# Patient Record
Sex: Female | Born: 1965 | Race: Black or African American | Hispanic: No | Marital: Married | Smoking: Never smoker
Health system: Southern US, Community
[De-identification: ages and names within clinical notes are randomized; demographics above are authoritative.]

## PROBLEM LIST (undated history)

## (undated) DIAGNOSIS — R000 Tachycardia, unspecified: Secondary | ICD-10-CM

## (undated) DIAGNOSIS — K222 Esophageal obstruction: Secondary | ICD-10-CM

## (undated) DIAGNOSIS — J45909 Unspecified asthma, uncomplicated: Secondary | ICD-10-CM

## (undated) DIAGNOSIS — N926 Irregular menstruation, unspecified: Secondary | ICD-10-CM

## (undated) HISTORY — DX: Irregular menstruation, unspecified: N92.6

## (undated) HISTORY — DX: Esophageal obstruction: K22.2

## (undated) HISTORY — DX: Tachycardia, unspecified: R00.0

## (undated) HISTORY — DX: Unspecified asthma, uncomplicated: J45.909

## (undated) HISTORY — PX: NASAL POLYP SURGERY: SHX186

---

## 1997-12-27 ENCOUNTER — Other Ambulatory Visit: Admission: RE | Admit: 1997-12-27 | Discharge: 1997-12-27 | Payer: Self-pay | Admitting: *Deleted

## 1998-01-24 ENCOUNTER — Ambulatory Visit (HOSPITAL_COMMUNITY): Admission: RE | Admit: 1998-01-24 | Discharge: 1998-01-24 | Payer: Self-pay | Admitting: *Deleted

## 1998-03-25 ENCOUNTER — Ambulatory Visit (HOSPITAL_COMMUNITY): Admission: RE | Admit: 1998-03-25 | Discharge: 1998-03-25 | Payer: Self-pay | Admitting: *Deleted

## 1998-07-01 ENCOUNTER — Encounter (HOSPITAL_COMMUNITY): Admission: RE | Admit: 1998-07-01 | Discharge: 1998-07-04 | Payer: Self-pay | Admitting: *Deleted

## 1998-07-04 ENCOUNTER — Inpatient Hospital Stay (HOSPITAL_COMMUNITY): Admission: AD | Admit: 1998-07-04 | Discharge: 1998-07-07 | Payer: Self-pay | Admitting: *Deleted

## 1998-07-16 HISTORY — PX: TUBAL LIGATION: SHX77

## 1999-06-16 ENCOUNTER — Ambulatory Visit (HOSPITAL_COMMUNITY): Admission: RE | Admit: 1999-06-16 | Discharge: 1999-06-16 | Payer: Self-pay | Admitting: *Deleted

## 2010-05-16 ENCOUNTER — Ambulatory Visit: Payer: Self-pay | Admitting: Obstetrics & Gynecology

## 2010-05-16 LAB — HM PAP SMEAR: HM Pap smear: NEGATIVE

## 2010-11-28 NOTE — Assessment & Plan Note (Signed)
[name], [name]             ACCOUNT NO.:  [id number]   MEDICAL RECORD NO.:  [id number]          PATIENT TYPE:  POB   LOCATION:  [facility] [place]         FACILITY:  [facility]   PHYSICIAN:  Melanie Lincoln, MD      DATE OF BIRTH:  Dec 13, 1965   DATE OF SERVICE:  05/16/2010                                  CLINIC NOTE   HISTORY OF PRESENT ILLNESS:  The patient is a 45 year old para 2 female  who presents for her yearly exam.  The patient has regular periods.  She  has had bleeding in between her periods for 10 years.  She says that  sometimes it is just spotting but sometimes a little more.  The patient  has had endometrial biopsy and ultrasound done at [facility] in  [place] [facility] and records can be requested of this.  The patient thinks it  has something to do with her tubal ligation.   PAST MEDICAL HISTORY:  Denies all medical problems.   PAST SURGICAL HISTORY:  C-section x2 and BTL.   GYN HISTORY:  No history of abnormal Pap smears, endometriosis, fibroid  tumors, ovarian cysts or sexually transmitted diseases.   SOCIAL HISTORY:  The patient is an Data processing manager at [employer].  She does not smoke.  She drinks rare alcohol, does not do drugs, and has  never been a victim of sexual or physical abuse.   FAMILY HISTORY:  Positive for diabetes and high blood pressure in her  mother.   MEDICATIONS:  Ibuprofen p.r.n.   ALLERGIES:  None.   SYSTEMIC REVIEW:  Negative for all 13 points.   PHYSICAL EXAMINATION:  VITAL SIGNS:  Pulse 65, blood pressure 108/67,  weight 168, and height 67-1/2 inch.  GENERAL:  Well nourished, well developed, no apparent stress.  HEENT:  Normocephalic and atraumatic.  Good dentition.  Thyroid, no  masses.  LUNGS:  Clear to auscultation bilaterally.  HEART:  Regular rate and rhythm.  BREASTS:  No masses, nontender.  No lymphadenopathy.  No nipple  discharge.  ABDOMEN:  Soft.  Nontender.  No organomegaly.  No hernia.  GENITALIA:  Tanner V.   Vagina pink, normal rugae.  Cervix is closed.  Slightly pointed to the patient's left, uterus anteverted, nontender.  Adnexa, no masses, nontender.  EXTREMITIES:  No edema.   ASSESSMENT AND PLAN:  A 45 year old female for well-woman exam.  1. Pap smear.  2. Mammogram is up-to-date per the patient.  3. The patient has had a physical exam with blood laboratories drawn.      All this will be followed up by primary care doctor for blood work.      Request medical records from [facility], biopsy, ultrasound      and the last 2 years of notes.  4. We will call the patient to make sure that everything is up-to-date      and normal with these records.  If no adequate workup has been      done, I will suggest a transvaginal ultrasound and endometrial      biopsy.           ______________________________  Melanie Lincoln, MD     KL/MEDQ  D:  05/16/2010  T:  05/17/2010  Job:  [id number]

## 2010-12-01 NOTE — H&P (Signed)
[facility]  PatientVIETTA Chang                       MRN: [id number] Adm. Date:  [id number] Attending:  Deniece Ree                         History and Physical  HISTORY:                      Patient is a 45 year old multiparous female who is desirous of permanent sterilization.  This patient has indicated that she totally wishes for permanent sterilization.  All of her questions were answered to her satisfaction.  She understands that this procedure is intended to be permanent;  however, cannot be guaranteed.  She is also aware of the different types of contraceptives available.  The possible complications have also been explained,  which this patient accepts.  PAST MEDICAL HISTORY, FAMILY HISTORY, REVIEW OF SYSTEMS:   Reveal that she has had cesarean sections x 2.  PHYSICAL EXAMINATION:  GENERAL:                      Reveals a well-developed, well-nourished female in no acute distress.  HEENT:                        Within normal limits.  NECK:                         Supple.  BREASTS:                      Without masses, tenderness, or discharge.  LUNGS:                        Clear to percussion and auscultation.  HEART:                        Normal sinus rhythm without murmur, rub, or gallop.  ABDOMEN:                      Benign.  EXTREMITIES, NEUROLOGIC:      Within normal limits.  PELVIC:                       Revealed external genitalia, BUSB within normal limits.  The vagina is clear.  Cervix firm and nontender.  The uterus is approximately six to seven weeks in size, slightly boggy.  The adnexa is benign.  DIAGNOSIS:                    Multiparity, desirous of permanent sterilization.  OPERATION:                    Laparoscopic bilateral tubular cauterization with  tubal resection. DD:  06/16/99 TD:  06/16/99 Job: [id number]

## 2010-12-15 LAB — HM MAMMOGRAPHY: HM Mammogram: NEGATIVE

## 2011-02-22 ENCOUNTER — Encounter: Payer: Self-pay | Admitting: *Deleted

## 2011-02-27 ENCOUNTER — Encounter: Payer: Self-pay | Admitting: Obstetrics & Gynecology

## 2011-02-27 ENCOUNTER — Other Ambulatory Visit: Payer: Self-pay | Admitting: Obstetrics & Gynecology

## 2011-02-27 ENCOUNTER — Ambulatory Visit (INDEPENDENT_AMBULATORY_CARE_PROVIDER_SITE_OTHER): Payer: PRIVATE HEALTH INSURANCE | Admitting: Obstetrics & Gynecology

## 2011-02-27 VITALS — BP 125/81 | HR 102 | Temp 98.0°F | Resp 16 | Ht 67.5 in | Wt 161.0 lb

## 2011-02-27 DIAGNOSIS — N949 Unspecified condition associated with female genital organs and menstrual cycle: Secondary | ICD-10-CM

## 2011-02-27 DIAGNOSIS — N926 Irregular menstruation, unspecified: Secondary | ICD-10-CM

## 2011-02-27 DIAGNOSIS — R1031 Right lower quadrant pain: Secondary | ICD-10-CM

## 2011-02-27 DIAGNOSIS — R102 Pelvic and perineal pain: Secondary | ICD-10-CM

## 2011-02-27 HISTORY — DX: Irregular menstruation, unspecified: N92.6

## 2011-02-27 MED ORDER — HYDROCODONE-ACETAMINOPHEN 5-500 MG PO TABS: 1.0000 | ORAL_TABLET | ORAL | Status: DC | PRN

## 2011-02-27 NOTE — Progress Notes (Signed)
  Subjective:    Patient ID: [name], female    DOB: 11-Feb-1966, 45 y.o.   MRN: [id number]  HPI Pt complaining of pain 9in right adnexa for 3 weeks.  Pain is not assic with sexual intercourse, bowel mvmts, or urination.  No activity makes it worse of better.  Pt takes OTC ibuprofen once a day that helps temporarily.  Pt has had ovarian cyst in the past.   Review of Systems  Constitutional: Negative.   Respiratory: Negative.   Cardiovascular: Negative.   Genitourinary: Positive for pelvic pain.  Musculoskeletal: Positive for back pain.  Neurological: Negative.   Psychiatric/Behavioral: Negative.        Objective:   Physical Exam  Constitutional: She appears well-developed and well-nourished. No distress.  HENT:  Head: Normocephalic and atraumatic.  Eyes: Conjunctivae are normal.  Pulmonary/Chest: Effort normal.  Abdominal: Soft. She exhibits no distension and no mass. There is no tenderness. There is no rebound and no guarding.  Genitourinary: Vagina normal. No vaginal discharge found.       No CMT.   Uterus feels nml size.  Pain in rt adnexa with deep palpation.  There is fullness but no mass.  Left adnexa is WNL.          Assessment & Plan:  45 yo female with right sided pelvic pain x 3 weeks and spotting between menses.  1.  TV US 2.  If poylp is seen on [place] will proceed with polypectomy +/- ablation.  Endomet bx would be warrented again (last biopsy is 04/2009). 3.  Will call pt with results of [place].

## 2011-02-27 NOTE — Patient Instructions (Signed)
Ovarian Cyst   The ovaries are small organs that are on each side of the uterus. The ovaries are the organs that produce the female hormones, estrogen and progesterone. An ovarian cyst is a sac filled with fluid that can vary in its size. It is normal for a small cyst to form in women who are in the childbearing age and who have menstrual periods. This type of cyst is called a follicle cyst that becomes an ovulation cyst (corpus luteum cyst) after it produces the women’s egg. It later goes away on its own if the woman does not become pregnant. There are other kinds of ovarian cysts that may cause problems and may need to be treated. The most serious problem is a cyst with cancer. It should be noted that menopausal women who have an ovarian cyst are at a higher risk of it being a cancer cyst. They should be evaluated very quickly, thoroughly and followed closely. This is especially true in menopausal women because of the high rate of ovarian cancer in women in menopause.   CAUSES AND TYPES OF OVARIAN CYSTS:   FUNCTIONAL CYST: The follicle/corpus luteum cyst is a functional cyst that occurs every month during ovulation with the menstrual cycle. They go away with the next menstrual cycle if the woman does not get pregnant. Usually, there are no symptoms with a functional cyst.   ENDOMETRIOMA CYST: This cyst develops from the lining of the uterus tissue. This cyst gets in or on the ovary. It grows every month from the bleeding during the menstrual period. It is also called a “chocolate cyst” because it becomes filled with blood that turns brown. This cyst can cause pain in the lower abdomen during intercourse and with your menstrual period.   CYSTADENOMA CYST: This cyst develops from the cells on the outside of the ovary. They usually are not cancerous. They can get very big and cause lower abdomen pain and pain with intercourse. This type of cyst can twist on itself, cut off its blood supply and cause severe pain. It  also can easily rupture and cause a lot of pain.   DERMOID CYST: This type of cyst is sometimes found in both ovaries. They are found to have different kinds of body tissue in the cyst. The tissue includes skin, teeth, hair, and/or cartilage. They usually do not have symptoms unless they get very big. Dermoid cysts are rarely cancerous.   POLYCYSTIC OVARY: This is a rare condition with hormone problems that produces many small cysts on both ovaries. The cysts are follicle-like cysts that never produce an egg and become a corpus luteum. It can cause an increase in body weight, infertility, acne, increase in body and facial hair and lack of menstrual periods or rare menstrual periods. Many women with this problem develop type 2 diabetes. The exact cause of this problem is unknown. A polycystic ovary is rarely cancerous.   THECA LUTEIN CYST: Occurs when too much hormone (human chorionic gonadotropin) is produced and over-stimulates the ovaries to produce an egg. They are frequently seen when doctors stimulate the ovaries for invitro-fertilization (test tube babies).   LUTEOMA CYST: This cyst is seen during pregnancy. Rarely it can cause an obstruction to the birth canal during labor and delivery. They usually go away after delivery.   SYMPTOMS   Pelvic pain or pressure.   Pain during sexual intercourse.   Increasing girth (swelling) of the abdomen.   Abnormal menstrual periods.   Increasing pain with menstrual   periods.   You stop having menstrual periods and you are not pregnant.   DIAGNOSIS   The diagnosis can be made during:   Routine or annual pelvic examination (common).   Ultrasound.   X-ray of the pelvis.   CT Scan.   MRI.   Blood tests.   TREATMENT   Treatment may only be to follow the cyst monthly for 2 to 3 months with your caregiver. Many go away on their own, especially functional cysts.   May be aspirated (drained) with a long needle with ultrasound, or by laparoscopy (inserting a tube into the pelvis  through a small incision).   The whole cyst can be removed by laparoscopy.   Sometimes the cyst may need to be removed through an incision in the lower abdomen.   Hormone treatment is sometimes used to help dissolve certain cysts.   Birth control pills are sometimes used to help dissolve certain cysts.   HOME CARE INSTRUCTIONS   Follow your caregiver's advice regarding:   Medicine.   Follow up visits to evaluate and treat the cyst.   You may need to come back or make an appointment with another caregiver, to find the exact cause of your cyst, if your caregiver is not a gynecologist.   Get your yearly and recommended pelvic examinations and Pap tests.   Let your caregiver know if you have had an ovarian cyst in the past.   SEEK MEDICAL CARE IF:   Your periods are late, irregular, they stop, or are painful.   Your stomach (abdomen) or pelvic pain does not go away.   Your stomach becomes larger or swollen.   You have pressure on your bladder or trouble emptying your bladder completely.   You have painful sexual intercourse.   You have feelings of fullness, pressure, or discomfort in your stomach.   You lose weight for no apparent reason.   You feel generally ill.   You become constipated.   You lose your appetite.   You develop acne.   You have an increase in body and facial hair.   You are gaining weight, without changing your exercise and eating habits.   You think you are pregnant.   SEEK IMMEDIATE MEDICAL CARE IF:   You have increasing abdominal pain.   You feel sick to your stomach (nausea) and/or vomit.   You develop a fever that comes on suddenly.   You develop abdominal pain during a bowel movement.   Your menstrual periods become heavier than usual.   Document Released: 07/02/2005 Document Re-Released: 06/20/2009   Exit[employer]® Patient Information ©2011 [employer].

## 2011-03-02 ENCOUNTER — Ambulatory Visit
Admission: RE | Admit: 2011-03-02 | Discharge: 2011-03-02 | Disposition: A | Discharge status: Home / Self Care | Payer: PRIVATE HEALTH INSURANCE | Source: Ambulatory Visit | Attending: Obstetrics & Gynecology | Admitting: Obstetrics & Gynecology

## 2011-03-02 DIAGNOSIS — R1031 Right lower quadrant pain: Secondary | ICD-10-CM

## 2011-03-02 DIAGNOSIS — R102 Pelvic and perineal pain: Secondary | ICD-10-CM

## 2011-03-21 ENCOUNTER — Encounter: Payer: Self-pay | Admitting: Obstetrics & Gynecology

## 2011-03-21 ENCOUNTER — Ambulatory Visit (INDEPENDENT_AMBULATORY_CARE_PROVIDER_SITE_OTHER): Payer: PRIVATE HEALTH INSURANCE | Admitting: Obstetrics & Gynecology

## 2011-03-21 VITALS — BP 142/80 | HR 73 | Wt 160.0 lb

## 2011-03-21 DIAGNOSIS — N938 Other specified abnormal uterine and vaginal bleeding: Secondary | ICD-10-CM

## 2011-03-21 DIAGNOSIS — N949 Unspecified condition associated with female genital organs and menstrual cycle: Secondary | ICD-10-CM

## 2011-03-21 DIAGNOSIS — N925 Other specified irregular menstruation: Secondary | ICD-10-CM

## 2011-03-21 LAB — POCT URINE PREGNANCY: Preg Test, Ur: NEGATIVE

## 2011-03-21 NOTE — Patient Instructions (Signed)
Endometrial Biopsy   This is a test in which a tissue sample (a biopsy) is taken from inside the uterus (womb). It is then looked at by a specialist under a microscope to see if the tissue is normal or abnormal. The endometrium is the lining of the uterus. This test helps determine where you are in your menstrual cycle and  how hormone levels are affecting the lining of the uterus. Another use for this test is to diagnose endometrial cancer, tuberculosis, polyps, or inflammatory conditions and to evaluate uterine bleeding.   PREPARATION FOR TEST: No preparation or fasting is necessary.   NORMAL FINDINGS: No pathologic conditions. Presence of "secretory-type" endometrium 3 to 5 days before to normal menstruation.   YOUR FINDINGS ARE:  __________________________________________________   MEANING OF TEST Your caregiver will go over the test results with you and discuss the importance and meaning of your results, as well as treatment options and the need for additional tests if necessary.   OBTAINING THE TEST RESULTS It is your responsibility to obtain your test results.  Ask the lab or department performing the test when and how you will get your results.   **"Normal" ranges for lab values and other tests may vary among different laboratories and/or hospitals.  You should always check with your doctor after having lab work or other tests done to discuss the meaning of your test results and whether or not your values are considered "within normal limits".   Document Released: 11/02/2004  Document Re-Released: 09/28/2008 [facility] Patient Information 2011 [place], [place].Endometriosis Cramps, Pain and Infertility Up to ten per cent of women in child bearing years have endometriosis. Endometriosis is a disease that occurs when the endometrium (lining of the uterus) is misplaced outside of its normal location. It may occur in many locations close to the uterus (womb), but commonly on the ovaries,  fallopian tubes, vagina (birth canal) and bowel located close to the uterus. Because the uterus sloughs (expels) its lining every month (menses), there is bleeding where ever the endometrial tissue is located. SYMPTOMS Often there are no symptoms (problems); however because blood is irritating to tissues not normally exposed to it, when symptoms occur they vary with the location of the misplaced endometrium. Symptoms often include back and abdominal (belly) pain. Periods may be heavier and intercourse may be painful. Infertility may be present. You may have all of these symptoms at one time or another or you may have months with no symptoms at all. Although the symptoms occur mainly during menses, they can occur mid-cycle as well, and usually terminate with menopause. DIAGNOSIS You will have to be seen by your caregiver for a diagnosis (learning what is wrong). Your caregiver may recommend a blood test and urine test (urinalysis) to help rule out other conditions. Another common test is ultrasound, a painless procedure that uses sound waves to make a sonogram "picture" of abnormal tissue that could be endometriosis. If your bowel movements are painful around your periods, your caregiver may advise a barium enema (an x-ray of the lower bowel), to try to find the source of your pain. This is sometimes confirmed by laparoscopy. Laparoscopy is a procedure where your caregiver looks into your abdomen with a laparoscope (a small pencil sized telescope). Your caregiver may take a tiny piece of tissue (biopsy) from any abnormal tissue to confirm or document your problem. These tissues are sent to the lab and a pathologist looks at them under the microscope to give a microscopic diagnoses. TREATMENT  Once the diagnosis is made, it can be treated by destruction of the misplaced endometrial tissue using heat (diathermy), laser, cutting (excision), or chemical means. It may also be treated with hormonal therapy. When using  hormonal therapy menses are eliminated, therefore eliminating the monthly exposure to blood by the misplaced endometrial tissue. Only in severe cases is it necessary to perform a hysterectomy with removal of the tubes, uterus and ovaries. HOME CARE INSTRUCTIONS  Only take over-the-counter or prescription medicines for pain, discomfort, or fever as directed by your caregiver.   Avoid activities that produce pain, including a physical sexual relationship.   Do not take aspirin as this may increase bleeding when not on hormonal therapy.   See your caregiver for pain or problems not controlled with treatment.  SEEK IMMEDIATE MEDICAL CARE IF:  Your pain is severe and is not responding to pain medication.   You develop severe nausea and vomiting or can't keep foods down.   Your pain localizes to the right lower part of your abdomen (possible appendicitis).   There is swelling or increasing pain in the abdomen.   An unexplained oral temperature above 102 F (38.9 C) develops, or as your caregiver suggests.   You see blood in your stool.  MAKE SURE YOU:   Understand these instructions.   Will watch your condition.   Will get help right away if you are not doing well or get worse.  Document Released: 06/29/2000 Document Re-Released: 06/14/2008 [facility] Patient Information 2011 [place], [place].

## 2011-03-21 NOTE — Progress Notes (Signed)
  Subjective:    Patient ID: [name], female    DOB: November 04, 1965, 45 y.o.   MRN: [id number]  HPI Pt c/o pelvic pain for 8 weeks. The pain is on the right side.  Pt's pain is relieved with 800 mg ibuprofen once a day.  Pain not increased with BM, urination, sex, or with menstrual cycle.  Pt also having irregular bleeding.  Pt spots frequently throughout the month.  US shows 15 mm lining, 2.4 cm intramural fibroid, no ovarian cysts.  Pt has BM two times a week. Pt reports stool caliber is soft and long.  Since lining is 15mm and the pt is bleeding irregularly, endometrial biopsy should be done to r/o hyperplasia or malignancy    Review of Systems  Constitutional: Negative.   Respiratory: Negative.   Gastrointestinal:       Some gas with taking ibuprofen.  Genitourinary: Positive for vaginal bleeding and pelvic pain.  Neurological: Negative for dizziness.  Psychiatric/Behavioral: Negative.        Objective:   Physical Exam  Constitutional: She is oriented to person, place, and time. She appears well-nourished. No distress.  HENT:  Head: Normocephalic and atraumatic.  Eyes: Conjunctivae are normal.  Pulmonary/Chest: Effort normal.  Abdominal: Soft. She exhibits no distension. There is no tenderness.  Genitourinary: Uterus normal. Uterus is not tender. Cervix exhibits no motion tenderness, no discharge and no friability. Right adnexum displays tenderness. Right adnexum displays no mass and no fullness. Left adnexum displays no mass, no tenderness and no fullness.    Neurological: She is alert and oriented to person, place, and time.  Skin: Skin is warm and dry.  Psychiatric: She has a normal mood and affect.   UPT negative  Patient given informed consent, signed copy in the chart, time out was performed. Appropriate time out taken. . The patient was placed in the lithotomy position and the cervix brought into view with sterile speculum.  Portio of cervix cleansed x 2 with betadine  swabs.  A tenaculum was placed in the anterior lip of the cervix.  The uterus was sounded for depth of 10 cm. A pipelle was introduced to into the uterus, suction created,  and an endometrial sample was obtained. All equipment was removed and accounted for.  The patient tolerated the procedure well.   Cervical biopsy taken at 10 o'clock.  Bleeding controlled with silver nitrate.   Patient given post procedure instructions.  Pt wants to be called with result.    Assessment & Plan:  45 yo female with left sided pelvic pain for 8 weeks and irregular bleeding.  1.  Small fibroid in uterus.  Uterus not tender.  Minimal pain with deep palpation in rt adnexa.  ?endometriosis?  If biopsies are negative, will proceed with abd pelvic CT scan with contrast.  Also GI consult should be considered.  2.  Irregular bleeding--endometrial biopsy with 2 passes  3.  Naproxen sodium for pain.

## 2011-03-30 ENCOUNTER — Telehealth: Payer: Self-pay | Admitting: *Deleted

## 2011-03-30 ENCOUNTER — Encounter: Payer: Self-pay | Admitting: *Deleted

## 2011-03-30 NOTE — Telephone Encounter (Signed)
Notified pt that her endometrial biopsy was benign and that Dr Penne Lash is recommending a pelvic CT.  Pt to check her schedule and call me back to schedule.  As of this date pt has not called to schedule and letter is sent to pt to remind her to call.

## 2012-01-03 ENCOUNTER — Other Ambulatory Visit: Payer: Self-pay | Admitting: Obstetrics & Gynecology

## 2012-01-03 DIAGNOSIS — Z1231 Encounter for screening mammogram for malignant neoplasm of breast: Secondary | ICD-10-CM

## 2012-01-22 ENCOUNTER — Ambulatory Visit: Payer: PRIVATE HEALTH INSURANCE

## 2012-01-22 ENCOUNTER — Ambulatory Visit (INDEPENDENT_AMBULATORY_CARE_PROVIDER_SITE_OTHER): Payer: PRIVATE HEALTH INSURANCE

## 2012-01-22 DIAGNOSIS — Z1231 Encounter for screening mammogram for malignant neoplasm of breast: Secondary | ICD-10-CM

## 2012-02-19 ENCOUNTER — Ambulatory Visit (INDEPENDENT_AMBULATORY_CARE_PROVIDER_SITE_OTHER): Payer: PRIVATE HEALTH INSURANCE | Admitting: Obstetrics & Gynecology

## 2012-02-19 ENCOUNTER — Encounter: Payer: Self-pay | Admitting: Obstetrics & Gynecology

## 2012-02-19 VITALS — BP 121/81 | HR 73 | Temp 98.5°F | Resp 16 | Ht 67.5 in | Wt 157.0 lb

## 2012-02-19 DIAGNOSIS — Z124 Encounter for screening for malignant neoplasm of cervix: Secondary | ICD-10-CM

## 2012-02-19 DIAGNOSIS — Z0000 Encounter for general adult medical examination without abnormal findings: Secondary | ICD-10-CM

## 2012-02-19 NOTE — Progress Notes (Signed)
Subjective:    [name] is a 46 y.o. female who presents for an annual exam. The patient has no complaints today. The patient is sexually active. GYN screening history: last pap: was normal. The patient wears seatbelts: yes. The patient participates in regular exercise: yes. Has the patient ever been transfused or tattooed?: no. The patient reports that there is not domestic violence in her life.   Menstrual History: OB History    Grav Para Term Preterm Abortions TAB SAB Ect Mult Living   2 2        2       Menarche age: 50 Patient's last menstrual period was 02/14/2012.    The following portions of the patient's history were reviewed and updated as appropriate: allergies, current medications, past family history, past medical history, past social history, past surgical history and problem list.  Review of Systems A comprehensive review of systems was negative. She has been married for 23 years, denies dysparunia. Her mammogram was normal last month. 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   Objective:    BP 121/81  Pulse 73  Temp 98.5 F (36.9 C) (Oral)  Resp 16  Ht 5' 7.5" (1.715 m)  Wt 157 lb (71.215 kg)  BMI 24.23 kg/m2  LMP 02/14/2012  General Appearance:    Alert, cooperative, no distress, appears stated age  Head:    Normocephalic, without obvious abnormality, atraumatic  Eyes:    PERRL, conjunctiva/corneas clear, EOM's intact, fundi    benign, both eyes  Ears:    Normal TM's and external ear canals, both ears  Nose:   Nares normal, septum midline, mucosa normal, no drainage    or sinus tenderness  Throat:   Lips, mucosa, and tongue normal; teeth and gums normal  Neck:   Supple, symmetrical, trachea midline, no adenopathy;    thyroid:  no enlargement/tenderness/nodules; no carotid   bruit or JVD  Back:     Symmetric, no curvature, ROM normal, no CVA tenderness  Lungs:     Clear to auscultation bilaterally, respirations unlabored  Chest Wall:    No  tenderness or deformity   Heart:    Regular rate and rhythm, S1 and S2 normal, no murmur, rub   or gallop  Breast Exam:    No tenderness, masses, or nipple abnormality  Abdomen:     Soft, non-tender, bowel sounds active all four quadrants,    no masses, no organomegaly  Genitalia:    Normal female without lesion, discharge or tenderness, NSSA, NT, no adnexal masses     Extremities:   Extremities normal, atraumatic, no cyanosis or edema  Pulses:   2+ and symmetric all extremities  Skin:   Skin color, texture, turgor normal, no rashes or lesions  Lymph nodes:   Cervical, supraclavicular, and axillary nodes normal  Neurologic:   CNII-XII intact, normal strength, sensation and reflexes    throughout  .    Assessment:    Healthy female exam.    Plan:     Pap smear.

## 2012-03-07 ENCOUNTER — Ambulatory Visit: Payer: PRIVATE HEALTH INSURANCE

## 2012-12-16 ENCOUNTER — Ambulatory Visit (INDEPENDENT_AMBULATORY_CARE_PROVIDER_SITE_OTHER): Payer: PRIVATE HEALTH INSURANCE | Admitting: Family Medicine

## 2012-12-16 ENCOUNTER — Encounter: Payer: Self-pay | Admitting: Family Medicine

## 2012-12-16 VITALS — BP 138/88 | HR 114 | Ht 67.5 in | Wt 142.0 lb

## 2012-12-16 DIAGNOSIS — Z130 Encounter for screening for diseases of the blood and blood-forming organs and certain disorders involving the immune mechanism: Secondary | ICD-10-CM

## 2012-12-16 DIAGNOSIS — K222 Esophageal obstruction: Secondary | ICD-10-CM

## 2012-12-16 DIAGNOSIS — Z131 Encounter for screening for diabetes mellitus: Secondary | ICD-10-CM

## 2012-12-16 DIAGNOSIS — Z13220 Encounter for screening for lipoid disorders: Secondary | ICD-10-CM

## 2012-12-16 DIAGNOSIS — R030 Elevated blood-pressure reading, without diagnosis of hypertension: Secondary | ICD-10-CM

## 2012-12-16 HISTORY — DX: Esophageal obstruction: K22.2

## 2012-12-16 NOTE — Progress Notes (Signed)
CC: [name] is a 47 y.o. female is here for Establish Care   Subjective: HPI:  Pleasant 47 year old here to establish care, works in exercise room downstairs , unfortunately husband diagnosed with stage IV colon cancer last month  Was told during esophageal dilation that her blood pressure was elevated, she is unsure of exact numbers. She denies history of elevated blood pressure. No outside blood pressures to report. She occasionally eats salty foods but does not add salt to food. She works out 3-4 times a week. Prior to this dilation she was having trouble fully swallowing solid foods and slightly having trouble with liquids. This is of moderate severity with all meals she is now able to eat or drink whatever she wants without difficulty.  She tells me she has a history of irregular menstrual bleeding. Over the past year this has been much more predictable without spotting between menses. She denies any irregularity over the last 12 months.  She believes it's been well over 10 years since her last tetanus immunization  Review of Systems - General ROS: negative for - chills, fever, night sweats, weight gain or weight loss Ophthalmic ROS: negative for - decreased vision Psychological ROS: negative for - anxiety or depression ENT ROS: negative for - hearing change, nasal congestion, tinnitus or allergies Hematological and Lymphatic ROS: negative for - bleeding problems, bruising or swollen lymph nodes Breast ROS: negative Respiratory ROS: no cough, shortness of breath, or wheezing Cardiovascular ROS: no chest pain or dyspnea on exertion Gastrointestinal ROS: no abdominal pain, change in bowel habits, or black or bloody stools Genito-Urinary ROS: negative for - genital discharge, genital ulcers, incontinence or abnormal bleeding from genitals Musculoskeletal ROS: negative for - joint pain or muscle pain Neurological ROS: negative for - headaches or memory loss Dermatological ROS:  negative for lumps, mole changes, rash and skin lesion changes  Past Medical History  Diagnosis Date  . Irregular menstrual bleeding 02/27/2011  . Esophageal stricture 12/16/2012    Dilated Spring 2014      Family History  Problem Relation Age of Onset  . Diabetes Mother   . Hypertension Mother      History  Substance Use Topics  . Smoking status: Never Smoker   . Smokeless tobacco: Never Used  . Alcohol Use: Yes     Comment: occasionally     Objective: Filed Vitals:   12/16/12 1616  BP: 138/88  Pulse:     General: Alert and Oriented, No Acute Distress HEENT: Pupils equal, round, reactive to light. Conjunctivae clear.  Moist mucous membranes Lungs: Clear to auscultation bilaterally, no wheezing/ronchi/rales.  Comfortable work of breathing. Good air movement. Cardiac: Regular rate and rhythm. Normal S1/S2.  No murmurs, rubs, nor gallops.   Extremities: No peripheral edema.  Strong peripheral pulses.  Mental Status: No depression, anxiety, nor agitation. Skin: Warm and dry.  Assessment & Plan: Vina was seen today for establish care.  Diagnoses and associated orders for this visit:  Screening, lipid - Lipid panel  Screening for diabetes mellitus - BASIC METABOLIC PANEL WITH GFR  Elevated blood pressure - TSH - BASIC METABOLIC PANEL WITH GFR  Screening, anemia, deficiency, iron - CBC  Esophageal stricture    Elevated blood pressure: On recheck manually systolic has improved by 15 points. Discussed diet and exercise interventions to minimize need for antihypertensives.  Will recheck at upcoming CPE. We'll rule out hyperthyroidism Esophageal stricture: Controlled and now asymptomatic, resolved She is due for lipid screening, fasting blood sugar, renal  function which will be obtained 2 days prior to CPE She would like to think about whether or not she wants to get a Pap smear this year, no history of abnormalities, most recent last year She will think about  whether or not she wants a tetanus immunization, encouraged to get today, however she would like some more time History of menstrual irregularity: Controlled and resolved, we'll screen for anemia or thrombocytopenia  30 minutes spent face-to-face during visit today of which at least 50% was counseling or coordinating care regarding elevated blood pressure, history of irregular menses, esophageal stricture, immunization counseling    Return in about 2 weeks (around 12/30/2012) for CPE.

## 2012-12-16 NOTE — Patient Instructions (Signed)
Think about the following  -Tetanus shot at upcoming visit  -complete physical exam, get labs done 2-3 business days beforehand  -Pap smear not needed until another 2-4 years but you can still receive one in our office either with me or with a female provider

## 2012-12-19 LAB — BASIC METABOLIC PANEL WITH GFR
BUN: 11 mg/dL (ref 6–23)
Calcium: 9.4 mg/dL (ref 8.4–10.5)
Creat: 0.76 mg/dL (ref 0.50–1.10)
GFR, Est African American: >89 mL/min
GFR, Est Non African American: >89 mL/min
Glucose, Bld: 85 mg/dL (ref 70–99)
Potassium: 4.2 mEq/L (ref 3.5–5.3)

## 2012-12-19 LAB — CBC
HCT: 27.7 % — ABNORMAL LOW (ref 36.0–46.0)
Hemoglobin: 8.4 g/dL — ABNORMAL LOW (ref 12.0–15.0)
MCV: 66.7 fL — ABNORMAL LOW (ref 78.0–100.0)
RBC: 4.15 MIL/uL (ref 3.87–5.11)
WBC: 4.4 10*3/uL (ref 4.0–10.5)

## 2012-12-19 LAB — LIPID PANEL: LDL Cholesterol: 103 mg/dL — ABNORMAL HIGH (ref 0–99)

## 2012-12-22 ENCOUNTER — Other Ambulatory Visit: Payer: Self-pay | Admitting: Family Medicine

## 2012-12-22 ENCOUNTER — Telehealth: Payer: Self-pay | Admitting: Family Medicine

## 2012-12-22 DIAGNOSIS — D649 Anemia, unspecified: Secondary | ICD-10-CM | POA: Insufficient documentation

## 2012-12-22 DIAGNOSIS — Z1231 Encounter for screening mammogram for malignant neoplasm of breast: Secondary | ICD-10-CM

## 2012-12-22 NOTE — Telephone Encounter (Addendum)
Sue Lush, Will you please let mrs. Scales know that her blood work showed a pretty significant low hemoglobin level.  I'd encourage her to start a OTC Iron Sulfate 325mg  tabet twice a day and return at her convenience to discuss further workup. Her cholesterol, kidney function, fasting blood sugar, and thyroid function were all within normal limits.

## 2012-12-22 NOTE — Telephone Encounter (Signed)
Left message for patient to call back. Labs.

## 2012-12-22 NOTE — Telephone Encounter (Signed)
Patient advised.

## 2013-01-15 LAB — BASIC METABOLIC PANEL: Potassium: 3.3 mmol/L — AB (ref 3.4–5.3)

## 2013-01-27 ENCOUNTER — Ambulatory Visit: Payer: PRIVATE HEALTH INSURANCE

## 2013-01-27 ENCOUNTER — Ambulatory Visit (INDEPENDENT_AMBULATORY_CARE_PROVIDER_SITE_OTHER): Payer: PRIVATE HEALTH INSURANCE

## 2013-01-27 DIAGNOSIS — Z1231 Encounter for screening mammogram for malignant neoplasm of breast: Secondary | ICD-10-CM

## 2013-01-28 ENCOUNTER — Encounter: Payer: Self-pay | Admitting: Family Medicine

## 2013-01-28 ENCOUNTER — Ambulatory Visit (INDEPENDENT_AMBULATORY_CARE_PROVIDER_SITE_OTHER): Payer: PRIVATE HEALTH INSURANCE | Admitting: Family Medicine

## 2013-01-28 ENCOUNTER — Telehealth: Payer: Self-pay | Admitting: Family Medicine

## 2013-01-28 VITALS — BP 143/87 | HR 88 | Temp 97.9°F | Wt 142.0 lb

## 2013-01-28 DIAGNOSIS — I471 Supraventricular tachycardia: Secondary | ICD-10-CM

## 2013-01-28 DIAGNOSIS — B9689 Other specified bacterial agents as the cause of diseases classified elsewhere: Secondary | ICD-10-CM

## 2013-01-28 DIAGNOSIS — R000 Tachycardia, unspecified: Secondary | ICD-10-CM

## 2013-01-28 DIAGNOSIS — J329 Chronic sinusitis, unspecified: Secondary | ICD-10-CM

## 2013-01-28 DIAGNOSIS — A499 Bacterial infection, unspecified: Secondary | ICD-10-CM

## 2013-01-28 DIAGNOSIS — D649 Anemia, unspecified: Secondary | ICD-10-CM

## 2013-01-28 MED ORDER — AMOXICILLIN-POT CLAVULANATE 500-125 MG PO TABS: ORAL_TABLET | ORAL | Status: AC

## 2013-01-28 MED ORDER — METOPROLOL SUCCINATE ER 25 MG PO TB24: 25.0000 mg | ORAL_TABLET | Freq: Every day | ORAL | Status: DC

## 2013-01-28 MED ORDER — MOMETASONE FUROATE 50 MCG/ACT NA SUSP: NASAL | Status: DC

## 2013-01-28 NOTE — Telephone Encounter (Signed)
Message left on vm 

## 2013-01-28 NOTE — Telephone Encounter (Signed)
Sue Lush, Will you please let Mrs. Scales know after reviewing her ED notes she should be seen by cardiology, nothing emergent, but i'll place a referral along with metoprolol refill.

## 2013-01-28 NOTE — Progress Notes (Signed)
CC: [name] is a 47 y.o. female is here for URI   Subjective: HPI:  Patient presents for same day appointment complaining of nasal congestion facial pressure described as moderate in severity localized below both eyes. Present for 3 weeks were seen on a weekly basis. Worse with leaning forward. She denies fevers, chills, cough, wheezing, shortness of breath, headache, motor sensory disturbances  Patient updates me that on the third of this month she was seen at a local emergency room for palpitations. She was prescribed metoprolol and is under the impression her calcium was low. She has been taking metoprolol tartrate 25 mg every evening she has not had any episodes since. She reports a racing heart that was not improving with deep breathing, she's had this decades ago without any episodes until now.  She's a history of anemia with a hemoglobin just above 8 last visit she has decided not to take iron supplements and instead increasing iron in her diet. She reports increased energy  Review Of Systems Outlined In HPI  Past Medical History  Diagnosis Date  . Irregular menstrual bleeding 02/27/2011  . Esophageal stricture 12/16/2012    Dilated Spring 2014      Family History  Problem Relation Age of Onset  . Diabetes Mother   . Hypertension Mother      History  Substance Use Topics  . Smoking status: Never Smoker   . Smokeless tobacco: Never Used  . Alcohol Use: Yes     Comment: occasionally     Objective: Filed Vitals:   01/28/13 1626  BP: 143/87  Pulse: 88  Temp: 97.9 F (36.6 C)    General: Alert and Oriented, No Acute Distress HEENT: Pupils equal, round, reactive to light. Conjunctivae clear.  External ears unremarkable, canals clear with intact TMs with appropriate landmarks.  Middle ear appears open without effusion. Boggy erythematous inferior turbinates.  Moist mucous membranes, pharynx without inflammation nor lesions.  Neck supple without palpable  lymphadenopathy nor abnormal masses. Maxillary sinus tenderness bilaterally Lungs: Clear to auscultation bilaterally, no wheezing/ronchi/rales.  Comfortable work of breathing. Good air movement. Cardiac: Regular rate and rhythm. Normal S1/S2.  No murmurs, rubs, nor gallops.   Extremities: No peripheral edema.  Strong peripheral pulses.  Mental Status: No depression, anxiety, nor agitation. Skin: Warm and dry.  Assessment & Plan: [name] was seen today for uri.  Diagnoses and associated orders for this visit:  Anemia  Bacterial sinusitis - amoxicillin-clavulanate (AUGMENTIN) 500-125 MG per tablet; Take one by mouth every 8 hours for ten total days. - mometasone (NASONEX) 50 MCG/ACT nasal spray; Two sprays each nostril daily.  Racing heart beat - metoprolol tartrate (LOPRESSOR) 25 MG tablet; Take 1 tablet (25 mg total) by mouth 2 (two) times daily.  Paroxysmal supraventricular tachycardia  Other Orders - Discontinue: metoprolol succinate (TOPROL-XL) 25 MG 24 hr tablet; Take 25 mg by mouth daily.    Bacterial sinusitis: Start Augmentin and Nasonex,Patient encouraged to use nasal sailine washes but avoid decongestants due to below. Racing heartbeat: After the patient left records were obtained revealing she had supraventricular tachycardia with a ventricular rate of 200 that required Adenocard x2 for final heart rate 120. She will be notified today before closing that encourage her to take Toprol-XL instead of immediate acting, please see additional phone note today. I do believe she warrants cardiology referral which has been placed. She did have mild hypokalemia however only at 3.3. Anemia: Improved 10.2 at emergency room since she is against iron supplements  I've encouraged her to continue with iron in her diet from green leafy vegetables and red meats.  25 minutes spent face-to-face during visit today of which at least 50% was counseling or coordinating care regarding  supraventricular tachycardia, anemia, bacterial sinusitis    Return if symptoms worsen or fail to improve.

## 2013-01-29 ENCOUNTER — Ambulatory Visit: Payer: PRIVATE HEALTH INSURANCE

## 2013-02-04 ENCOUNTER — Encounter: Payer: Self-pay | Admitting: *Deleted

## 2013-02-16 ENCOUNTER — Encounter: Payer: Self-pay | Admitting: Family Medicine

## 2013-02-20 ENCOUNTER — Encounter: Payer: Self-pay | Admitting: Family Medicine

## 2013-03-31 ENCOUNTER — Encounter: Payer: Self-pay | Admitting: Obstetrics & Gynecology

## 2013-03-31 ENCOUNTER — Ambulatory Visit (INDEPENDENT_AMBULATORY_CARE_PROVIDER_SITE_OTHER): Payer: PRIVATE HEALTH INSURANCE | Admitting: Obstetrics & Gynecology

## 2013-03-31 VITALS — BP 149/82 | HR 132 | Resp 16 | Ht 67.5 in | Wt 144.0 lb

## 2013-03-31 DIAGNOSIS — Z124 Encounter for screening for malignant neoplasm of cervix: Secondary | ICD-10-CM

## 2013-03-31 DIAGNOSIS — Z1151 Encounter for screening for human papillomavirus (HPV): Secondary | ICD-10-CM

## 2013-03-31 DIAGNOSIS — Z01419 Encounter for gynecological examination (general) (routine) without abnormal findings: Secondary | ICD-10-CM

## 2013-03-31 DIAGNOSIS — Z0000 Encounter for general adult medical examination without abnormal findings: Secondary | ICD-10-CM

## 2013-03-31 NOTE — Patient Instructions (Signed)

## 2013-03-31 NOTE — Progress Notes (Signed)
Subjective:    [name] is a 47 y.o. female who presents for an annual exam. The patient has no complaints today.  The patient is sexually active. GYN screening history: last pap: was normal. The patient wears seatbelts: yes. The patient participates in regular exercise: yes. Has the patient ever been transfused or tattooed?: no. The patient reports that there is not domestic violence in her life.   Menstrual History: OB History   Grav Para Term Preterm Abortions TAB SAB Ect Mult Living   2 2        2       Menarche age: 37  Patient's last menstrual period was 03/18/2013.    The following portions of the patient's history were reviewed and updated as appropriate: allergies, current medications, past family history, past medical history, past social history, past surgical history and problem list.  Review of Systems A comprehensive review of systems was negative.  Married for 24 years, Husband 11 yo diagnosed with Stage 4 colon cancer. Public house manager at [employer]. Mammogram 8/14 normal   Objective:    BP 149/82  Pulse 132  Resp 16  Ht 5' 7.5" (1.715 m)  Wt 144 lb (65.318 kg)  BMI 22.21 kg/m2  LMP 03/18/2013  General Appearance:    Alert, cooperative, no distress, appears stated age  Head:    Normocephalic, without obvious abnormality, atraumatic  Eyes:    PERRL, conjunctiva/corneas clear, EOM's intact, fundi    benign, both eyes  Ears:    Normal TM's and external ear canals, both ears  Nose:   Nares normal, septum midline, mucosa normal, no drainage    or sinus tenderness  Throat:   Lips, mucosa, and tongue normal; teeth and gums normal  Neck:   Supple, symmetrical, trachea midline, no adenopathy;    thyroid:  no enlargement/tenderness/nodules; no carotid   bruit or JVD  Back:     Symmetric, no curvature, ROM normal, no CVA tenderness  Lungs:     Clear to auscultation bilaterally, respirations unlabored  Chest Wall:    No tenderness or deformity   Heart:    Regular rate and  rhythm, S1 and S2 normal, no murmur, rub   or gallop  Breast Exam:    No tenderness, masses, or nipple abnormality  Abdomen:     Soft, non-tender, bowel sounds active all four quadrants,    no masses, no organomegaly  Genitalia:    Normal female without lesion, discharge or tenderness, probable 3 cm posterior fibroid, mobile, NT, normal adnexal exam, some vulvar atrophy     Extremities:   Extremities normal, atraumatic, no cyanosis or edema  Pulses:   2+ and symmetric all extremities  Skin:   Skin color, texture, turgor normal, no rashes or lesions  Lymph nodes:   Cervical, supraclavicular, and axillary nodes normal  Neurologic:   CNII-XII intact, normal strength, sensation and reflexes    throughout  .    Assessment:    Healthy female exam.  Some depression about her husband's cancer. No HI/SI. She declines lexapro at this time, but she can have a prescription in the future if she desires.   Plan:     Breast self exam technique reviewed and patient encouraged to perform self-exam monthly. Thin prep Pap smear. with cotesting  Anemia and she reports normal, not heavy periods. I have recommended a GI for rule out colon cancer.

## 2013-05-21 ENCOUNTER — Other Ambulatory Visit: Payer: Self-pay | Admitting: Family Medicine

## 2013-07-30 ENCOUNTER — Emergency Department
Admission: EM | Admit: 2013-07-30 | Discharge: 2013-07-30 | Disposition: A | Discharge status: Home / Self Care | Payer: BC Managed Care – PPO | Source: Home / Self Care | Attending: Family Medicine | Admitting: Family Medicine

## 2013-07-30 ENCOUNTER — Encounter: Payer: Self-pay | Admitting: Emergency Medicine

## 2013-07-30 DIAGNOSIS — J40 Bronchitis, not specified as acute or chronic: Secondary | ICD-10-CM

## 2013-07-30 DIAGNOSIS — J069 Acute upper respiratory infection, unspecified: Secondary | ICD-10-CM

## 2013-07-30 MED ORDER — AZITHROMYCIN 250 MG PO TABS: ORAL_TABLET | ORAL | Status: DC

## 2013-07-30 NOTE — ED Notes (Signed)
Patient c/o cough for 2 wks. Pt has tried otc Mucinex DM and Robitussin without relief.

## 2013-07-30 NOTE — ED Provider Notes (Signed)
CSN: [id number]     Arrival date & time 07/30/13  1445 History   First MD Initiated Contact with Patient 07/30/13 1453     Chief Complaint  Patient presents with  . Cough    HPI  URI Symptoms Onset: 2 weeks  Description: rhinorrhea, nasal congestion, cough  Modifying factors:  Prior hx/o nasal obstruction/polyps. On nasal steroids.   Symptoms Nasal discharge: yes Fever: no Sore throat: no Cough: yes Wheezing: no Ear pain: no GI symptoms: no Sick contacts: no  Red Flags  Stiff neck: no Dyspnea: no Rash: no Swallowing difficulty: no  Sinusitis Risk Factors Headache/face pain: no Double sickening: no tooth pain: no  Allergy Risk Factors Sneezing: no Itchy scratchy throat: no Seasonal symptoms: no  Flu Risk Factors Headache: no muscle aches: no severe fatigue: no   Past Medical History  Diagnosis Date  . Irregular menstrual bleeding 02/27/2011  . Esophageal stricture 12/16/2012    Dilated Spring 2014   . Tachycardia    Past Surgical History  Procedure Laterality Date  . Cesarean section      x 2 last in 99  . Tubal ligation  2000   Family History  Problem Relation Age of Onset  . Diabetes Mother   . Hypertension Mother    History  Substance Use Topics  . Smoking status: Never Smoker   . Smokeless tobacco: Never Used  . Alcohol Use: Yes     Comment: occasionally   OB History   Grav Para Term Preterm Abortions TAB SAB Ect Mult Living   2 2        2      Review of Systems  All other systems reviewed and are negative.    Allergies  Review of patient's allergies indicates no known allergies.  Home Medications   Current Outpatient Rx  Name  Route  Sig  Dispense  Refill  . metoprolol succinate (TOPROL-XL) 25 MG 24 hr tablet      TAKE 1 TABLET (25 MG TOTAL) BY MOUTH DAILY.   30 tablet   3   . mometasone (NASONEX) 50 MCG/ACT nasal spray   Nasal   Place 2 sprays into the nose daily.          BP 159/91  Pulse 121  Temp(Src) 98.7 F  (37.1 C) (Oral)  Resp 20  Ht 5' 7.5" (1.715 m)  Wt 149 lb 4 oz (67.699 kg)  BMI 23.02 kg/m2  SpO2 100% Physical Exam  Constitutional: She is oriented to person, place, and time. She appears well-developed and well-nourished.  HENT:  Head: Normocephalic and atraumatic.  Right Ear: External ear normal.  Left Ear: External ear normal.  +nasal erythema, rhinorrhea bilaterally, + post oropharyngeal erythema    Eyes: Conjunctivae are normal. Pupils are equal, round, and reactive to light.  Neck: Normal range of motion. Neck supple.  Cardiovascular: Normal rate and regular rhythm.   Pulmonary/Chest: Effort normal and breath sounds normal.  Abdominal: Soft.  Musculoskeletal: Normal range of motion.  Neurological: She is alert and oriented to person, place, and time.  Skin: Skin is warm.    ED Course  Procedures (including critical care time) Labs Review Labs Reviewed - No data to display Imaging Review No results found.  EKG Interpretation    Date/Time:    Ventricular Rate:    PR Interval:    QRS Duration:   QT Interval:    QTC Calculation:   R Axis:     Text Interpretation:  MDM   1. URI (upper respiratory infection)   2. Bronchitis    Likely viral vs. Post viral source of sxs.  Will place on zpak for lower resp coverage Discussed supportive care and infectious/resp red flags.  Follow up as needed.     The patient and/or caregiver has been counseled thoroughly with regard to treatment plan and/or medications prescribed including dosage, schedule, interactions, rationale for use, and possible side effects and they verbalize understanding. Diagnoses and expected course of recovery discussed and will return if not improved as expected or if the condition worsens. Patient and/or caregiver verbalized understanding.         Shanda Howells, MD 07/30/13 [phone]

## 2013-08-01 ENCOUNTER — Telehealth: Payer: Self-pay

## 2013-08-02 NOTE — ED Notes (Signed)
Inquired about patient's status; encourage them to call with questions/concerns.  

## 2013-09-18 ENCOUNTER — Telehealth: Payer: Self-pay

## 2013-09-18 MED ORDER — METOPROLOL SUCCINATE ER 25 MG PO TB24: 25.0000 mg | ORAL_TABLET | Freq: Every day | ORAL | Status: DC

## 2013-09-18 NOTE — Telephone Encounter (Signed)
Refilled metoprolol 

## 2013-09-21 ENCOUNTER — Ambulatory Visit (INDEPENDENT_AMBULATORY_CARE_PROVIDER_SITE_OTHER): Payer: BC Managed Care – PPO | Admitting: Family Medicine

## 2013-09-21 ENCOUNTER — Encounter: Payer: Self-pay | Admitting: Family Medicine

## 2013-09-21 VITALS — BP 139/78 | HR 115 | Wt 152.0 lb

## 2013-09-21 DIAGNOSIS — D509 Iron deficiency anemia, unspecified: Secondary | ICD-10-CM

## 2013-09-21 DIAGNOSIS — K5900 Constipation, unspecified: Secondary | ICD-10-CM

## 2013-09-21 DIAGNOSIS — I471 Supraventricular tachycardia: Secondary | ICD-10-CM

## 2013-09-21 DIAGNOSIS — J309 Allergic rhinitis, unspecified: Secondary | ICD-10-CM

## 2013-09-21 DIAGNOSIS — D649 Anemia, unspecified: Secondary | ICD-10-CM

## 2013-09-21 MED ORDER — METOPROLOL SUCCINATE ER 25 MG PO TB24: 25.0000 mg | ORAL_TABLET | Freq: Every day | ORAL | Status: DC

## 2013-09-21 MED ORDER — MONTELUKAST SODIUM 10 MG PO TABS: 10.0000 mg | ORAL_TABLET | Freq: Every day | ORAL | Status: DC

## 2013-09-21 NOTE — Progress Notes (Signed)
CC: [name] is a 48 y.o. female is here for Annual Exam   Subjective: HPI:  Patient was scheduled and a complete physical exam slot however she does not know why she needs an annual exam visit and did not explicitly request this.  She would rather focus on chronic condition management  History of anemia: No longer taking any iron supplementation. She is working out with cardio most days of the week down stairs without any decline in subjective cardiovascular reserve. Denies chest pain, shortness of breath, melena nor blood in stool.  Followup allergic rhinitis: Since I saw her last she was seen in ear nose and throat and started on Nasonex which has been of no benefit of moderate to severe nasal congestion is present on a daily basis for the past 6 months. Decongestants or quite helpful however interfere and worsen her palpitations and rapid heartbeat. She wants to know if there's any other intervention other than surgery on her sinuses to help with the above.  Complains of intermittent constipation described as movements every 2-3 days. The longer she waits to have a bowel movement the more likely her hemorrhoids will become inflamed. No interventions as of yet other than Preparation H once hemorrhoids become bothersome  Supraventricular tachycardia: Since starting metoprolol she is quite happy with lack of racing heart. Denies irregular heartbeat nor any motor or sensory disturbances since I saw her last     Review Of Systems Outlined In HPI  Past Medical History  Diagnosis Date  . Irregular menstrual bleeding 02/27/2011  . Esophageal stricture 12/16/2012    Dilated Spring 2014   . Tachycardia     Past Surgical History  Procedure Laterality Date  . Cesarean section      x 2 last in 99  . Tubal ligation  2000   Family History  Problem Relation Age of Onset  . Diabetes Mother   . Hypertension Mother     History   Social History  . Marital Status: Married    Spouse  Name: N/A    Number of Children: N/A  . Years of Education: N/A   Occupational History  . 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 Smoking status: Never Smoker   . Smokeless tobacco: Never Used  . Alcohol Use: Yes     Comment: occasionally  . Drug Use: No  . Sexual Activity: Yes    Partners: Male    Birth Control/ Protection: Surgical   Other Topics Concern  . Not on file   Social History Narrative  . No narrative on file     Objective: BP 139/78  Pulse 115  Wt 152 lb (68.947 kg)  General: Alert and Oriented, No Acute Distress HEENT: Pupils equal, round, reactive to light. Conjunctivae clear.  External ears unremarkable, canals clear with intact TMs with appropriate landmarks.  Middle ear appears open without effusion. Boggy erythematous inferior turbinates with moderate clear discharge.  Moist mucous membranes, pharynx without inflammation nor lesions however moderate post nasal drip.  Neck supple without palpable lymphadenopathy nor abnormal masses. Lungs: Clear to auscultation bilaterally, no wheezing/ronchi/rales.  Comfortable work of breathing. Good air movement. Cardiac: Regular rate and rhythm. Normal S1/S2.  No Mental Status: No depression, anxiety, nor agitation. Skin: Warm and dry.  Assessment & Plan: [name] was seen today for annual exam.  Diagnoses and associated orders for this visit:  Anemia - CBC  Anemia, iron deficiency  Allergic rhinitis - montelukast (SINGULAIR)  10 MG tablet; Take 1 tablet (10 mg total) by mouth at bedtime.  Constipation  Other Orders - metoprolol succinate (TOPROL-XL) 25 MG 24 hr tablet; Take 1 tablet (25 mg total) by mouth daily.    Anemia: Due for CBC to check hemoglobin, if she remains anemic will refer to GI for consideration of colonoscopy Allergic rhinitis: Uncontrolled chronic condition, she was given a sample of qnasl using 80 mcg each nostril on a daily basis, if not improved after one  week start Singulair. Constipation: Discuss using psyllium on a daily basis to help regulate bowel movements and minimize chances of hemorrhage layers in the future Supraventricular tachycardia: Controlled continue metoprolol  Return if symptoms worsen or fail to improve.

## 2013-09-21 NOTE — Patient Instructions (Signed)
Psyllium powder, one heaping teaspoon daily to help prevent hemorrhoids.

## 2013-09-22 LAB — CBC
HEMATOCRIT: 30.5 % — AB (ref 36.0–46.0)
HEMOGLOBIN: 9.5 g/dL — AB (ref 12.0–15.0)
MCH: 22.5 pg — AB (ref 26.0–34.0)
MCHC: 31.1 g/dL (ref 30.0–36.0)
MCV: 72.3 fL — ABNORMAL LOW (ref 78.0–100.0)
Platelets: 306 10*3/uL (ref 150–400)
RBC: 4.22 MIL/uL (ref 3.87–5.11)
RDW: 19.2 % — ABNORMAL HIGH (ref 11.5–15.5)
WBC: 5.1 10*3/uL (ref 4.0–10.5)

## 2013-09-23 ENCOUNTER — Telehealth: Payer: Self-pay | Admitting: Family Medicine

## 2013-09-23 DIAGNOSIS — D509 Iron deficiency anemia, unspecified: Secondary | ICD-10-CM

## 2013-09-23 NOTE — Telephone Encounter (Signed)
[name], Will you please let patient know that her hemoglobin still remains significantly low.  I'd encourage her to begin taking OTC Iron Sulfate 325mg  twice a day with meals.  It's important to have a colonoscopy when anemia persists this long so I've placed a GI referral to see if they agree.  F/U one month to recheck hemoglobin.

## 2013-09-24 NOTE — Telephone Encounter (Signed)
Left message on vm

## 2013-09-25 ENCOUNTER — Encounter: Payer: Self-pay | Admitting: Gastroenterology

## 2013-09-25 ENCOUNTER — Telehealth: Payer: Self-pay | Admitting: *Deleted

## 2013-09-25 NOTE — Telephone Encounter (Signed)
Pt wanted to know what she could do about constipation while taking a iron. i called pt back and told her that she could take otc stool softener any brand while on iron. She can also try miralax. Pt voiced undertanding

## 2013-10-14 ENCOUNTER — Encounter: Payer: Self-pay | Admitting: Emergency Medicine

## 2013-10-14 ENCOUNTER — Emergency Department
Admission: EM | Admit: 2013-10-14 | Discharge: 2013-10-14 | Disposition: A | Discharge status: Home / Self Care | Payer: BC Managed Care – PPO | Source: Home / Self Care | Attending: Family Medicine | Admitting: Family Medicine

## 2013-10-14 DIAGNOSIS — J069 Acute upper respiratory infection, unspecified: Secondary | ICD-10-CM

## 2013-10-14 MED ORDER — FLUTICASONE-SALMETEROL 100-50 MCG/DOSE IN AEPB: 1.0000 | INHALATION_SPRAY | Freq: Two times a day (BID) | RESPIRATORY_TRACT | Status: DC

## 2013-10-14 MED ORDER — BENZONATATE 200 MG PO CAPS: 200.0000 mg | ORAL_CAPSULE | Freq: Every day | ORAL | Status: DC

## 2013-10-14 MED ORDER — AZITHROMYCIN 250 MG PO TABS: ORAL_TABLET | ORAL | Status: DC

## 2013-10-14 NOTE — Discharge Instructions (Signed)
Take plain Mucinex (1200 mg guaifenesin) twice daily for cough and congestion.  Increase fluid intake, rest.  Resume Qnasl nose spray. Also recommend using saline nasal spray several times daily and saline nasal irrigation (AYR is a common brand) Try warm salt water gargles for sore throat.  Stop all antihistamines for now, and other non-prescription cough/cold preparations.   Follow-up with family doctor if not improving 7 to 10 days.

## 2013-10-14 NOTE — ED Provider Notes (Signed)
CSN: [id number]     Arrival date & time 10/14/13  1442 History   First MD Initiated Contact with Patient 10/14/13 1501     Chief Complaint  Patient presents with  . Nasal Congestion  . Cough      HPI Comments: Patient developed mild sore throat and increased sinus congestion one week ago.  She has a history of seasonal rhinitis.  She then developed a non-productive cough, worse at night.  She has occasional wheezing.  The history is provided by the patient.    Past Medical History  Diagnosis Date  . Irregular menstrual bleeding 02/27/2011  . Esophageal stricture 12/16/2012    Dilated Spring 2014   . Tachycardia    Past Surgical History  Procedure Laterality Date  . Cesarean section      x 2 last in 99  . Tubal ligation  2000   Family History  Problem Relation Age of Onset  . Diabetes Mother   . Hypertension Mother    History  Substance Use Topics  . Smoking status: Never Smoker   . Smokeless tobacco: Never Used  . Alcohol Use: Yes     Comment: occasionally   OB History   Grav Para Term Preterm Abortions TAB SAB Ect Mult Living   2 2        2      Review of Systems + sore throat, resolved + cough No pleuritic pain + wheezing + nasal congestion + post-nasal drainage No sinus pain/pressure No itchy/red eyes ? earache No hemoptysis No SOB No fever/chills No nausea No vomiting No abdominal pain No diarrhea No urinary symptoms No skin rash + fatigue No myalgias + headache Used OTC meds without relief  Allergies  Review of patient's allergies indicates no known allergies.  Home Medications   Current Outpatient Rx  Name  Route  Sig  Dispense  Refill  . ferrous sulfate 325 (65 FE) MG tablet   Oral   Take 325 mg by mouth daily with breakfast.         . azithromycin (ZITHROMAX Z-PAK) 250 MG tablet      Take 2 tabs today; then begin one tab once daily for 4 more days.   6 each   0   . Beclomethasone Dipropionate (QNASL) 80 MCG/ACT AERS      Two  puffs each nostril daily.         . benzonatate (TESSALON) 200 MG capsule   Oral   Take 1 capsule (200 mg total) by mouth at bedtime. Take as needed for cough   12 capsule   0   . Fluticasone-Salmeterol (ADVAIR DISKUS) 100-50 MCG/DOSE AEPB   Inhalation   Inhale 1 puff into the lungs 2 (two) times daily.   60 each   1   . metoprolol succinate (TOPROL-XL) 25 MG 24 hr tablet   Oral   Take 1 tablet (25 mg total) by mouth daily.   30 tablet   9   . montelukast (SINGULAIR) 10 MG tablet   Oral   Take 1 tablet (10 mg total) by mouth at bedtime.   30 tablet   3    BP 157/89  Pulse 87  Temp(Src) 98.3 F (36.8 C) (Oral)  Resp 14  Wt 150 lb (68.04 kg)  SpO2 99%  LMP 10/09/2013 Physical Exam Nursing notes and Vital Signs reviewed. Appearance:  Patient appears healthy, stated age, and in no acute distress Eyes:  Pupils are equal, round, and reactive to light  and accomodation.  Extraocular movement is intact.  Conjunctivae are not inflamed  Ears:  Canals normal.  Tympanic membranes normal.  Nose:  Mildly congested turbinates.  No sinus tenderness.  Pharynx:  Normal Neck:  Supple.   Non-tender enlarged posterior nodes are palpated bilaterally  Lungs:  Mild posterior bibasilar wheezes.  Breath sounds are equal.  Heart:  Regular rate and rhythm without murmurs, rubs, or gallops.  Abdomen:  Nontender without masses or hepatosplenomegaly.  Bowel sounds are present.  No CVA or flank tenderness.  Extremities:  No edema.  No calf tenderness Skin:  No rash present.   ED Course  Procedures  none      MDM   1. Acute upper respiratory infections of unspecified site    Begin Z-pack for atypical coverage.  Prescription written for Benzonatate [facility]) to take at bedtime for night-time cough.  Rx for Advair for bronchospasm. Take plain Mucinex (1200 mg guaifenesin) twice daily for cough and congestion.  Increase fluid intake, rest.  Resume Qnasl nose spray. Also recommend using  saline nasal spray several times daily and saline nasal irrigation (AYR is a common brand) Try warm salt water gargles for sore throat.  Stop all antihistamines for now, and other non-prescription cough/cold preparations.   Follow-up with family doctor if not improving 7 to 10 days.     Kandra Nicolas, MD 10/17/13 1239

## 2013-10-14 NOTE — ED Notes (Signed)
[name] c/o nasal congestion, wet but non-productive cough and runny nose x 1 week. No fever. Taken otc Tussin .

## 2013-10-26 ENCOUNTER — Ambulatory Visit (INDEPENDENT_AMBULATORY_CARE_PROVIDER_SITE_OTHER): Payer: BC Managed Care – PPO | Admitting: Family Medicine

## 2013-10-26 ENCOUNTER — Encounter: Payer: Self-pay | Admitting: Family Medicine

## 2013-10-26 VITALS — BP 151/87 | HR 109 | Wt 144.0 lb

## 2013-10-26 DIAGNOSIS — D509 Iron deficiency anemia, unspecified: Secondary | ICD-10-CM

## 2013-10-26 DIAGNOSIS — D649 Anemia, unspecified: Secondary | ICD-10-CM

## 2013-10-26 LAB — POCT HEMOGLOBIN: HEMOGLOBIN: 13.3 g/dL (ref 12.2–16.2)

## 2013-10-26 NOTE — Progress Notes (Signed)
CC: [name] is a 48 y.o. female is here for f/u hemoglobin   Subjective: HPI:  Followup anemia: Since I saw her last she's been taking iron sulfate twice a day with meals and a stool softener. She states that she has increased energy and overall sense of well-being since I saw her last. Her only complaint is loose stools due to her stool softener. She defecates one to 2 times a day. She denies any blood in stool or melena. Denies constipation. She reports that she has had irregular periods but has been evaluated by her OB/GYN and was told that everything is fine with her. On chart review she does have a uterine fibroid 2 cm in 2012. Denies shortness of breath, irregular heartbeat, racing heartbeat, nor chest pain.   Review Of Systems Outlined In HPI  Past Medical History  Diagnosis Date  . Irregular menstrual bleeding 02/27/2011  . Esophageal stricture 12/16/2012    Dilated Spring 2014   . Tachycardia     Past Surgical History  Procedure Laterality Date  . Cesarean section      x 2 last in 99  . Tubal ligation  2000   Family History  Problem Relation Age of Onset  . Diabetes Mother   . Hypertension Mother     History   Social History  . Marital Status: Married    Spouse Name: N/A    Number of Children: N/A  . Years of Education: N/A   Occupational History  . 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 Smoking status: Never Smoker   . Smokeless tobacco: Never Used  . Alcohol Use: Yes     Comment: occasionally  . Drug Use: No  . Sexual Activity: Yes    Partners: Male    Birth Control/ Protection: Surgical   Other Topics Concern  . Not on file   Social History Narrative  . No narrative on file     Objective: BP 151/87  Pulse 109  Wt 144 lb (65.318 kg)  LMP 10/09/2013  General: Alert and Oriented, No Acute Distress HEENT: Pupils equal, round, reactive to light. Conjunctivae clear.  Moist membranes Lungs: Clear to auscultation  bilaterally, no wheezing/ronchi/rales.  Comfortable work of breathing. Good air movement. Cardiac: Regular rate and rhythm. Normal S1/S2.  No murmurs, rubs, nor gallops.   Extremities: No peripheral edema.  Strong peripheral pulses.  Mental Status: No depression, anxiety, nor agitation. Skin: Warm and dry.  Assessment & Plan: Havilah was seen today for f/u hemoglobin.  Diagnoses and associated orders for this visit:  Anemia, iron deficiency - Cancel: Hemoglobin - POCT hemoglobin  Anemia    Iron deficiency anemia: Continue on iron sulfate twice a day titrate down on stool softener until 1 pain with bowel movement on a daily basis. She was given stool cards today, I encouraged her to keep her GI consultation appointment however if stool cards are negative this may help influence decision whether or not to get a colonoscopy with her gastroenterologist.  Return if symptoms worsen or fail to improve.

## 2013-10-28 ENCOUNTER — Telehealth: Payer: Self-pay | Admitting: *Deleted

## 2013-10-28 NOTE — Telephone Encounter (Signed)
Pt request a rx for cough meds. Pt is taking otc med for cough but its not helping

## 2013-10-29 MED ORDER — BENZONATATE 100 MG PO CAPS: 100.0000 mg | ORAL_CAPSULE | Freq: Three times a day (TID) | ORAL | Status: DC | PRN

## 2013-10-29 NOTE — Telephone Encounter (Signed)
[name], Rx sent to [facility] on [address].

## 2013-10-29 NOTE — Telephone Encounter (Signed)
Pt notified via vm

## 2013-11-09 ENCOUNTER — Ambulatory Visit: Payer: BC Managed Care – PPO | Admitting: Gastroenterology

## 2013-12-17 ENCOUNTER — Emergency Department (HOSPITAL_BASED_OUTPATIENT_CLINIC_OR_DEPARTMENT_OTHER): Payer: BC Managed Care – PPO

## 2013-12-17 ENCOUNTER — Emergency Department (HOSPITAL_BASED_OUTPATIENT_CLINIC_OR_DEPARTMENT_OTHER)
Admission: EM | Admit: 2013-12-17 | Discharge: 2013-12-17 | Disposition: A | Discharge status: Home / Self Care | Payer: BC Managed Care – PPO | Attending: Emergency Medicine | Admitting: Emergency Medicine

## 2013-12-17 DIAGNOSIS — IMO0002 Reserved for concepts with insufficient information to code with codable children: Secondary | ICD-10-CM | POA: Insufficient documentation

## 2013-12-17 DIAGNOSIS — R102 Pelvic and perineal pain: Secondary | ICD-10-CM

## 2013-12-17 DIAGNOSIS — Z3202 Encounter for pregnancy test, result negative: Secondary | ICD-10-CM | POA: Insufficient documentation

## 2013-12-17 DIAGNOSIS — Z79899 Other long term (current) drug therapy: Secondary | ICD-10-CM | POA: Insufficient documentation

## 2013-12-17 DIAGNOSIS — Z8742 Personal history of other diseases of the female genital tract: Secondary | ICD-10-CM | POA: Insufficient documentation

## 2013-12-17 DIAGNOSIS — Z8719 Personal history of other diseases of the digestive system: Secondary | ICD-10-CM | POA: Insufficient documentation

## 2013-12-17 DIAGNOSIS — N949 Unspecified condition associated with female genital organs and menstrual cycle: Secondary | ICD-10-CM | POA: Insufficient documentation

## 2013-12-17 LAB — LIPASE, BLOOD: Lipase: 25 U/L (ref 11–59)

## 2013-12-17 LAB — WET PREP, GENITAL
Clue Cells Wet Prep HPF POC: NONE SEEN
TRICH WET PREP: NONE SEEN
YEAST WET PREP: NONE SEEN

## 2013-12-17 LAB — URINALYSIS, ROUTINE W REFLEX MICROSCOPIC
BILIRUBIN URINE: NEGATIVE
Glucose, UA: NEGATIVE mg/dL
Hgb urine dipstick: NEGATIVE
Ketones, ur: NEGATIVE mg/dL
Leukocytes, UA: NEGATIVE
NITRITE: NEGATIVE
PROTEIN: NEGATIVE mg/dL
SPECIFIC GRAVITY, URINE: 1.005 (ref 1.005–1.030)
UROBILINOGEN UA: 0.2 mg/dL (ref 0.0–1.0)
pH: 6 (ref 5.0–8.0)

## 2013-12-17 LAB — COMPREHENSIVE METABOLIC PANEL
ALT: 7 U/L (ref 0–35)
AST: 15 U/L (ref 0–37)
Albumin: 4.2 g/dL (ref 3.5–5.2)
Alkaline Phosphatase: 40 U/L (ref 39–117)
BILIRUBIN TOTAL: 0.4 mg/dL (ref 0.3–1.2)
BUN: 7 mg/dL (ref 6–23)
CO2: 23 meq/L (ref 19–32)
CREATININE: 0.6 mg/dL (ref 0.50–1.10)
Calcium: 9.8 mg/dL (ref 8.4–10.5)
Chloride: 102 mEq/L (ref 96–112)
GLUCOSE: 105 mg/dL — AB (ref 70–99)
Potassium: 4.3 mEq/L (ref 3.7–5.3)
SODIUM: 139 meq/L (ref 137–147)
Total Protein: 7.9 g/dL (ref 6.0–8.3)

## 2013-12-17 LAB — CBC WITH DIFFERENTIAL/PLATELET
Basophils Absolute: 0 10*3/uL (ref 0.0–0.1)
Basophils Relative: 0 % (ref 0–1)
Eosinophils Absolute: 0.1 10*3/uL (ref 0.0–0.7)
Eosinophils Relative: 2 % (ref 0–5)
HEMATOCRIT: 40.7 % (ref 36.0–46.0)
HEMOGLOBIN: 13.5 g/dL (ref 12.0–15.0)
LYMPHS ABS: 1.4 10*3/uL (ref 0.7–4.0)
LYMPHS PCT: 30 % (ref 12–46)
MCH: 28.2 pg (ref 26.0–34.0)
MCHC: 33.2 g/dL (ref 30.0–36.0)
MCV: 85.1 fL (ref 78.0–100.0)
MONO ABS: 0.5 10*3/uL (ref 0.1–1.0)
Monocytes Relative: 11 % (ref 3–12)
NEUTROS ABS: 2.6 10*3/uL (ref 1.7–7.7)
Neutrophils Relative %: 57 % (ref 43–77)
Platelets: 253 10*3/uL (ref 150–400)
RBC: 4.78 MIL/uL (ref 3.87–5.11)
RDW: 17.3 % — AB (ref 11.5–15.5)
WBC: 4.6 10*3/uL (ref 4.0–10.5)

## 2013-12-17 LAB — RPR

## 2013-12-17 LAB — PREGNANCY, URINE: PREG TEST UR: NEGATIVE

## 2013-12-17 LAB — HIV ANTIBODY (ROUTINE TESTING W REFLEX): HIV 1&2 Ab, 4th Generation: NONREACTIVE

## 2013-12-17 MED ORDER — NAPROXEN 500 MG PO TABS: 500.0000 mg | ORAL_TABLET | Freq: Two times a day (BID) | ORAL | Status: DC

## 2013-12-17 NOTE — ED Notes (Signed)
Pt. Reports she is having lower R abd. And lower R quadrant pain with lower R back pain.

## 2013-12-17 NOTE — ED Provider Notes (Signed)
CSN: [id number]     Arrival date & time 12/17/13  1553 History   First MD Initiated Contact with Patient 12/17/13 1616     Chief Complaint  Patient presents with  . Abdominal Pain     (Consider location/radiation/quality/duration/timing/severity/associated sxs/prior Treatment) Patient is a 48 y.o. female presenting with abdominal pain. The history is provided by the patient.  Abdominal Pain She complains of right suprapubic pain with radiation to the right flank. Pain has been present for the last week. It is mild and she rates it at 3/10. Pain is constant. Nothing makes it better nothing makes it worse. Specifically, she denies a change with eating, urinating, bowel movement, sexual activity. There is no associated fever, chills, sweats. There is no nausea, vomiting, diarrhea. There's no urinary urgency, frequency, tenesmus, dysuria. Last menses was May 10 and was normal. She is status post tubal ligation. She has taken ibuprofen which has given her partial, temporary relief. She went to an urgent care Center but was told to come here because she might need an ultrasound.  Past Medical History  Diagnosis Date  . Irregular menstrual bleeding 02/27/2011  . Esophageal stricture 12/16/2012    Dilated Spring 2014   . Tachycardia    Past Surgical History  Procedure Laterality Date  . Cesarean section      x 2 last in 99  . Tubal ligation  2000   Family History  Problem Relation Age of Onset  . Diabetes Mother   . Hypertension Mother    History  Substance Use Topics  . Smoking status: Never Smoker   . Smokeless tobacco: Never Used  . Alcohol Use: Yes     Comment: occasionally   OB History   Grav Para Term Preterm Abortions TAB SAB Ect Mult Living   2 2        2      Review of Systems  Gastrointestinal: Positive for abdominal pain.  All other systems reviewed and are negative.     Allergies  Review of patient's allergies indicates no known allergies.  Home Medications    Prior to Admission medications   Medication Sig Start Date End Date Taking? Authorizing Provider  Beclomethasone Dipropionate (QNASL) 80 MCG/ACT AERS Two puffs each nostril daily. 09/21/13   Sean Hommel, DO  benzonatate (TESSALON PERLES) 100 MG capsule Take 1 capsule (100 mg total) by mouth 3 (three) times daily as needed for cough. 10/29/13 10/29/14  Sean Hommel, DO  ferrous sulfate 325 (65 FE) MG tablet Take 325 mg by mouth daily with breakfast.    Historical Provider, MD  Fluticasone-Salmeterol (ADVAIR DISKUS) 100-50 MCG/DOSE AEPB Inhale 1 puff into the lungs 2 (two) times daily. 10/14/13   Kandra Nicolas, MD  metoprolol succinate (TOPROL-XL) 25 MG 24 hr tablet Take 1 tablet (25 mg total) by mouth daily. 09/21/13   Sean Hommel, DO  montelukast (SINGULAIR) 10 MG tablet Take 1 tablet (10 mg total) by mouth at bedtime. 09/21/13   Sean Hommel, DO   BP 155/89  Pulse 90  Temp(Src) 98.6 F (37 C) (Oral)  Resp 18  Ht 5' 7.5" (1.715 m)  Wt 144 lb (65.318 kg)  BMI 22.21 kg/m2  SpO2 100%  LMP 11/24/2013 Physical Exam  Nursing note and vitals reviewed.  48 year old female, resting comfortably and in no acute distress. Vital signs are significant for hypertension with blood pressure 155/89. Oxygen saturation is 100%, which is normal. Head is normocephalic and atraumatic. PERRLA, EOMI. Oropharynx is clear.  Neck is nontender and supple without adenopathy or JVD. Back is nontender and there is no CVA tenderness. Lungs are clear without rales, wheezes, or rhonchi. Chest is nontender. Heart has regular rate and rhythm without murmur. Abdomen is soft, flat, with minimal tenderness in the right suprapubic area her tenderness is only present on deep palpation. There is no rebound or guarding. There are no masses or hepatosplenomegaly and peristalsis is normoactive. Pelvic: Normal external female genitalia. Cervix has a small cyst in the anterior lip. There is no bleeding or discharge. On bimanual exam, fundus  is retroverted and normal size and nontender. There no adnexal masses or tenderness. There is no cervical motion tenderness. Extremities have no cyanosis or edema, full range of motion is present. Skin is warm and dry without rash. Neurologic: Mental status is normal, cranial nerves are intact, there are no motor or sensory deficits.  ED Course  Procedures (including critical care time) Labs Review Results for orders placed during the hospital encounter of 12/17/13  WET PREP, GENITAL      Result Value Ref Range   Yeast Wet Prep HPF POC NONE SEEN  NONE SEEN   Trich, Wet Prep NONE SEEN  NONE SEEN   Clue Cells Wet Prep HPF POC NONE SEEN  NONE SEEN   WBC, Wet Prep HPF POC FEW (*) NONE SEEN  URINALYSIS, ROUTINE W REFLEX MICROSCOPIC      Result Value Ref Range   Color, Urine YELLOW  YELLOW   APPearance CLEAR  CLEAR   Specific Gravity, Urine 1.005  1.005 - 1.030   pH 6.0  5.0 - 8.0   Glucose, UA NEGATIVE  NEGATIVE mg/dL   Hgb urine dipstick NEGATIVE  NEGATIVE   Bilirubin Urine NEGATIVE  NEGATIVE   Ketones, ur NEGATIVE  NEGATIVE mg/dL   Protein, ur NEGATIVE  NEGATIVE mg/dL   Urobilinogen, UA 0.2  0.0 - 1.0 mg/dL   Nitrite NEGATIVE  NEGATIVE   Leukocytes, UA NEGATIVE  NEGATIVE  PREGNANCY, URINE      Result Value Ref Range   Preg Test, Ur NEGATIVE  NEGATIVE  CBC WITH DIFFERENTIAL      Result Value Ref Range   WBC 4.6  4.0 - 10.5 K/uL   RBC 4.78  3.87 - 5.11 MIL/uL   Hemoglobin 13.5  12.0 - 15.0 g/dL   HCT 40.7  36.0 - 46.0 %   MCV 85.1  78.0 - 100.0 fL   MCH 28.2  26.0 - 34.0 pg   MCHC 33.2  30.0 - 36.0 g/dL   RDW 17.3 (*) 11.5 - 15.5 %   Platelets 253  150 - 400 K/uL   Neutrophils Relative % 57  43 - 77 %   Neutro Abs 2.6  1.7 - 7.7 K/uL   Lymphocytes Relative 30  12 - 46 %   Lymphs Abs 1.4  0.7 - 4.0 K/uL   Monocytes Relative 11  3 - 12 %   Monocytes Absolute 0.5  0.1 - 1.0 K/uL   Eosinophils Relative 2  0 - 5 %   Eosinophils Absolute 0.1  0.0 - 0.7 K/uL   Basophils  Relative 0  0 - 1 %   Basophils Absolute 0.0  0.0 - 0.1 K/uL  COMPREHENSIVE METABOLIC PANEL      Result Value Ref Range   Sodium 139  137 - 147 mEq/L   Potassium 4.3  3.7 - 5.3 mEq/L   Chloride 102  96 - 112 mEq/L   CO2 23  19 - 32 mEq/L   Glucose, Bld 105 (*) 70 - 99 mg/dL   BUN 7  6 - 23 mg/dL   Creatinine, Ser 0.60  0.50 - 1.10 mg/dL   Calcium 9.8  8.4 - 10.5 mg/dL   Total Protein 7.9  6.0 - 8.3 g/dL   Albumin 4.2  3.5 - 5.2 g/dL   AST 15  0 - 37 U/L   ALT 7  0 - 35 U/L   Alkaline Phosphatase 40  39 - 117 U/L   Total Bilirubin 0.4  0.3 - 1.2 mg/dL   GFR calc non Af Amer >90  >90 mL/min   GFR calc Af Amer >90  >90 mL/min  LIPASE, BLOOD      Result Value Ref Range   Lipase 25  11 - 59 U/L   Imaging Review US Transvaginal Non-ob  12/17/2013   CLINICAL DATA:  Right-sided pelvic pain for 2 weeks. Prior tubal ligation and C-section.  EXAM: TRANSABDOMINAL AND TRANSVAGINAL ULTRASOUND OF PELVIS  TECHNIQUE: Both transabdominal and transvaginal ultrasound examinations of the pelvis were performed. Transabdominal technique was performed for global imaging of the pelvis including uterus, ovaries, adnexal regions, and pelvic cul-de-sac. It was necessary to proceed with endovaginal exam following the transabdominal exam to visualize the endometrium and ovaries.  COMPARISON:  None  FINDINGS: Uterus  Measurements: 10.9 x 7.5 x 6.1 cm. Anteverted, anteflexed. Posterior uterine fundal intramural fibroid with peripheral calcification measures 4.1 x 3.3 x 2.5 cm. Posterior uterine body intramural fibroid measures 1.8 x 1.7 x 1.1 cm.  Endometrium  Thickness: 5 mm.  No focal abnormality visualized.  Right ovary  Measurements: 4.8 x 3.8 x 3.6 cm. Simple appearing cyst measures 4.0 x 3.1 x 2.9 cm.  Left ovary  Measurements: 6.1 x 4.0 x 2.1 cm. Simple appearing cyst measures 3.1 x 2.7 x 1.9 cm.  Other findings  No free fluid.  IMPRESSION: Intramural fibroids as above.  Simple appearing ovarian cysts incidentally  noted. These are most likely incidental findings, although if the patient's right-sided pelvic pain persists, follow-up imaging could be performed in 4-6 weeks during the week following the patient's menses.   Electronically Signed   By: Conchita Paris M.D.   On: 12/17/2013 18:46   US Pelvis Complete  12/17/2013   CLINICAL DATA:  Right-sided pelvic pain for 2 weeks. Prior tubal ligation and C-section.  EXAM: TRANSABDOMINAL AND TRANSVAGINAL ULTRASOUND OF PELVIS  TECHNIQUE: Both transabdominal and transvaginal ultrasound examinations of the pelvis were performed. Transabdominal technique was performed for global imaging of the pelvis including uterus, ovaries, adnexal regions, and pelvic cul-de-sac. It was necessary to proceed with endovaginal exam following the transabdominal exam to visualize the endometrium and ovaries.  COMPARISON:  None  FINDINGS: Uterus  Measurements: 10.9 x 7.5 x 6.1 cm. Anteverted, anteflexed. Posterior uterine fundal intramural fibroid with peripheral calcification measures 4.1 x 3.3 x 2.5 cm. Posterior uterine body intramural fibroid measures 1.8 x 1.7 x 1.1 cm.  Endometrium  Thickness: 5 mm.  No focal abnormality visualized.  Right ovary  Measurements: 4.8 x 3.8 x 3.6 cm. Simple appearing cyst measures 4.0 x 3.1 x 2.9 cm.  Left ovary  Measurements: 6.1 x 4.0 x 2.1 cm. Simple appearing cyst measures 3.1 x 2.7 x 1.9 cm.  Other findings  No free fluid.  IMPRESSION: Intramural fibroids as above.  Simple appearing ovarian cysts incidentally noted. These are most likely incidental findings, although if the patient's right-sided pelvic pain persists, follow-up imaging could  be performed in 4-6 weeks during the week following the patient's menses.   Electronically Signed   By: Conchita Paris M.D.   On: 12/17/2013 18:46   MDM   Final diagnoses:  Pelvic pain    Right suprapubic and flank pain of uncertain cause. Workup has been initiated.  Urinalysis is negative and CBC is normal.  Pelvic exam is unremarkable. She will be sent for pelvic ultrasound to make sure no occult GYN pathology. At this point, anticipate treating her symptomatically with follow up with PCP if symptoms persist or worsen.  Workup is unremarkable. Incidental findings of small bilateral ovarian cysts and uterine fibroids that are clinically and related to her pain. She will need to follow up with her gynecologist regarding these. In the meantime, she will be treated symptomatically with naproxen and she's given a prescription for same.  Delora Fuel, MD 25/95/63 [id number]

## 2013-12-17 NOTE — Discharge Instructions (Signed)
Your evaluation did not show a reason for your pain. For now, I will treat you symptomatically. You have been given a prescription for naproxen which she should take twice a day. You may take over-the-counter Aleve in place of the naproxen. A dose of over-the-counter Aleve is 2 tablets at a time, twice a day. He may also take acetaminophen as needed for pain not relieved by the naproxen. Followup with your PCP if symptoms are getting worse. Your ultrasound also showed a small cyst on each ovary and a small fibroid(benign tumor of the uterus). These can be followed by your gynecologist.  Naproxen and naproxen sodium oral immediate-release tablets What is this medicine? NAPROXEN (na PROX en) is a non-steroidal anti-inflammatory drug (NSAID). It is used to reduce swelling and to treat pain. This medicine may be used for dental pain, headache, or painful monthly periods. It is also used for painful joint and muscular problems such as arthritis, tendinitis, bursitis, and gout. This medicine may be used for other purposes; ask your health care provider or pharmacist if you have questions. COMMON BRAND NAME(S): Aflaxen, Aleve Arthritis, Aleve, All Day Relief, Anaprox DS, Anaprox, Naprosyn What should I tell my health care provider before I take this medicine? They need to know if you have any of these conditions: -asthma -cigarette smoker -drink more than 3 alcohol containing drinks a day -heart disease or circulation problems such as heart failure or leg edema (fluid retention) -high blood pressure -kidney disease -liver disease -stomach bleeding or ulcers -an unusual or allergic reaction to naproxen, aspirin, other NSAIDs, other medicines, foods, dyes, or preservatives -pregnant or trying to get pregnant -breast-feeding How should I use this medicine? Take this medicine by mouth with a glass of water. Follow the directions on the prescription label. Take it with food if your stomach gets upset. Try  to not lie down for at least 10 minutes after you take it. Take your medicine at regular intervals. Do not take your medicine more often than directed. Long-term, continuous use may increase the risk of heart attack or stroke. A special MedGuide will be given to you by the pharmacist with each prescription and refill. Be sure to read this information carefully each time. Talk to your pediatrician regarding the use of this medicine in children. Special care may be needed. Overdosage: If you think you have taken too much of this medicine contact a poison control center or emergency room at once. NOTE: This medicine is only for you. Do not share this medicine with others. What if I miss a dose? If you miss a dose, take it as soon as you can. If it is almost time for your next dose, take only that dose. Do not take double or extra doses. What may interact with this medicine? -alcohol -aspirin -cidofovir -diuretics -lithium -methotrexate -other drugs for inflammation like ketorolac or prednisone -pemetrexed -probenecid -warfarin This list may not describe all possible interactions. Give your health care provider a list of all the medicines, herbs, non-prescription drugs, or dietary supplements you use. Also tell them if you smoke, drink alcohol, or use illegal drugs. Some items may interact with your medicine. What should I watch for while using this medicine? Tell your doctor or health care professional if your pain does not get better. Talk to your doctor before taking another medicine for pain. Do not treat yourself. This medicine does not prevent heart attack or stroke. In fact, this medicine may increase the chance of a heart attack  or stroke. The chance may increase with longer use of this medicine and in people who have heart disease. If you take aspirin to prevent heart attack or stroke, talk with your doctor or health care professional. Do not take other medicines that contain aspirin,  ibuprofen, or naproxen with this medicine. Side effects such as stomach upset, nausea, or ulcers may be more likely to occur. Many medicines available without a prescription should not be taken with this medicine. This medicine can cause ulcers and bleeding in the stomach and intestines at any time during treatment. Do not smoke cigarettes or drink alcohol. These increase irritation to your stomach and can make it more susceptible to damage from this medicine. Ulcers and bleeding can happen without warning symptoms and can cause death. You may get drowsy or dizzy. Do not drive, use machinery, or do anything that needs mental alertness until you know how this medicine affects you. Do not stand or sit up quickly, especially if you are an older patient. This reduces the risk of dizzy or fainting spells. This medicine can cause you to bleed more easily. Try to avoid damage to your teeth and gums when you brush or floss your teeth. What side effects may I notice from receiving this medicine? Side effects that you should report to your doctor or health care professional as soon as possible: -black or bloody stools, blood in the urine or vomit -blurred vision -chest pain -difficulty breathing or wheezing -nausea or vomiting -severe stomach pain -skin rash, skin redness, blistering or peeling skin, hives, or itching -slurred speech or weakness on one side of the body -swelling of eyelids, throat, lips -unexplained weight gain or swelling -unusually weak or tired -yellowing of eyes or skin Side effects that usually do not require medical attention (report to your doctor or health care professional if they continue or are bothersome): -constipation -headache -heartburn This list may not describe all possible side effects. Call your doctor for medical advice about side effects. You may report side effects to FDA at 1-800-FDA-1088. Where should I keep my medicine? Keep out of the reach of  children. Store at room temperature between 15 and 30 degrees C (59 and 86 degrees F). Keep container tightly closed. Throw away any unused medicine after the expiration date. NOTE: This sheet is a summary. It may not cover all possible information. If you have questions about this medicine, talk to your doctor, pharmacist, or health care provider.  2014, Elsevier/Gold Standard. (2009-07-04 20:10:16)  Ovarian Cyst An ovarian cyst is a fluid-filled sac that forms on an ovary. The ovaries are small organs that produce eggs in women. Various types of cysts can form on the ovaries. Most are not cancerous. Many do not cause problems, and they often go away on their own. Some may cause symptoms and require treatment. Common types of ovarian cysts include:  Functional cysts These cysts may occur every month during the menstrual cycle. This is normal. The cysts usually go away with the next menstrual cycle if the woman does not get pregnant. Usually, there are no symptoms with a functional cyst.  Endometrioma cysts These cysts form from the tissue that lines the uterus. They are also called "chocolate cysts" because they become filled with blood that turns brown. This type of cyst can cause pain in the lower abdomen during intercourse and with your menstrual period.  Cystadenoma cysts This type develops from the cells on the outside of the ovary. These cysts can get very  big and cause lower abdomen pain and pain with intercourse. This type of cyst can twist on itself, cut off its blood supply, and cause severe pain. It can also easily rupture and cause a lot of pain.  Dermoid cysts This type of cyst is sometimes found in both ovaries. These cysts may contain different kinds of body tissue, such as skin, teeth, hair, or cartilage. They usually do not cause symptoms unless they get very big.  Theca lutein cysts These cysts occur when too much of a certain hormone (human chorionic gonadotropin) is produced and  overstimulates the ovaries to produce an egg. This is most common after procedures used to assist with the conception of a baby (in vitro fertilization). CAUSES   Fertility drugs can cause a condition in which multiple large cysts are formed on the ovaries. This is called ovarian hyperstimulation syndrome.  A condition called polycystic ovary syndrome can cause hormonal imbalances that can lead to nonfunctional ovarian cysts. SIGNS AND SYMPTOMS  Many ovarian cysts do not cause symptoms. If symptoms are present, they may include:  Pelvic pain or pressure.  Pain in the lower abdomen.  Pain during sexual intercourse.  Increasing girth (swelling) of the abdomen.  Abnormal menstrual periods.  Increasing pain with menstrual periods.  Stopping having menstrual periods without being pregnant. DIAGNOSIS  These cysts are commonly found during a routine or annual pelvic exam. Tests may be ordered to find out more about the cyst. These tests may include:  Ultrasound.  X-ray of the pelvis.  CT scan.  MRI.  Blood tests. TREATMENT  Many ovarian cysts go away on their own without treatment. Your health care provider may want to check your cyst regularly for 2 3 months to see if it changes. For women in menopause, it is particularly important to monitor a cyst closely because of the higher rate of ovarian cancer in menopausal women. When treatment is needed, it may include any of the following:  A procedure to drain the cyst (aspiration). This may be done using a long needle and ultrasound. It can also be done through a laparoscopic procedure. This involves using a thin, lighted tube with a tiny camera on the end (laparoscope) inserted through a small incision.  Surgery to remove the whole cyst. This may be done using laparoscopic surgery or an open surgery involving a larger incision in the lower abdomen.  Hormone treatment or birth control pills. These methods are sometimes used to help  dissolve a cyst. HOME CARE INSTRUCTIONS   Only take over-the-counter or prescription medicines as directed by your health care provider.  Follow up with your health care provider as directed.  Get regular pelvic exams and Pap tests. SEEK MEDICAL CARE IF:   Your periods are late, irregular, or painful, or they stop.  Your pelvic pain or abdominal pain does not go away.  Your abdomen becomes larger or swollen.  You have pressure on your bladder or trouble emptying your bladder completely.  You have pain during sexual intercourse.  You have feelings of fullness, pressure, or discomfort in your stomach.  You lose weight for no apparent reason.  You feel generally ill.  You become constipated.  You lose your appetite.  You develop acne.  You have an increase in body and facial hair.  You are gaining weight, without changing your exercise and eating habits.  You think you are pregnant. SEEK IMMEDIATE MEDICAL CARE IF:   You have increasing abdominal pain.  You feel sick  to your stomach (nauseous), and you throw up (vomit).  You develop a fever that comes on suddenly.  You have abdominal pain during a bowel movement.  Your menstrual periods become heavier than usual. Document Released: 07/02/2005 Document Revised: 04/22/2013 Document Reviewed: 03/09/2013 [facility] Patient Information 2014 [place].  Uterine Fibroid A uterine fibroid is a growth (tumor) that occurs in your uterus. This type of tumor is not cancerous and does not spread out of the uterus. You can have one or many fibroids. Fibroids can vary in size, weight, and where they grow in the uterus. Some can become quite large. Most fibroids do not require medical treatment, but some can cause pain or heavy bleeding during and between periods. CAUSES  A fibroid is the result of a single uterine cell that keeps growing (unregulated), which is different than most cells in the human body. Most cells have a  control mechanism that keeps them from reproducing without control.  SIGNS AND SYMPTOMS   Bleeding.  Pelvic pain and pressure.  Bladder problems due to the size of the fibroid.  Infertility and miscarriages depending on the size and location of the fibroid. DIAGNOSIS  Uterine fibroids are diagnosed through a physical exam. Your health care provider may feel the lumpy tumors during a pelvic exam. Ultrasonography may be done to get information regarding size, location, and number of tumors.  TREATMENT   Your health care provider may recommend watchful waiting. This involves getting the fibroid checked by your health care provider to see if it grows or shrinks.   Hormone treatment or an intrauterine device (IUD) may be prescribed.   Surgery may be needed to remove the fibroids (myomectomy) or the uterus (hysterectomy). This depends on your situation. When fibroids interfere with fertility and a woman wants to become pregnant, a health care provider may recommend having the fibroids removed.  Echo care depends on how you were treated. In general:   Keep all follow-up appointments with your health care provider.   Only take over-the-counter or prescription medicines as directed by your health care provider. If you were prescribed a hormone treatment, take the hormone medicines exactly as directed. Do not take aspirin. It can cause bleeding.   Talk to your health care provider about taking iron pills.  If your periods are troublesome but not so heavy, lie down with your feet raised slightly above your heart. Place cold packs on your lower abdomen.   If your periods are heavy, write down the number of pads or tampons you use per month. Bring this information to your health care provider.   Include green vegetables in your diet.  SEEK IMMEDIATE MEDICAL CARE IF:  You have pelvic pain or cramps not controlled with medicines.   You have a sudden increase in  pelvic pain.   You have an increase in bleeding between and during periods.   You have excessive periods and soak tampons or pads in a half hour or less.  You feel lightheaded or have fainting episodes. Document Released: 06/29/2000 Document Revised: 04/22/2013 Document Reviewed: 01/29/2013 [facility] Patient Information 2014 [place], [place].

## 2013-12-19 LAB — GC/CHLAMYDIA PROBE AMP
CT Probe RNA: NEGATIVE
GC Probe RNA: NEGATIVE

## 2013-12-22 ENCOUNTER — Telehealth: Payer: Self-pay | Admitting: *Deleted

## 2013-12-22 NOTE — Telephone Encounter (Signed)
Pt dropped off stool cards and all three were negative for blood. Pt was notified

## 2013-12-23 ENCOUNTER — Other Ambulatory Visit: Payer: Self-pay | Admitting: Family Medicine

## 2013-12-23 DIAGNOSIS — Z0000 Encounter for general adult medical examination without abnormal findings: Secondary | ICD-10-CM

## 2014-01-21 ENCOUNTER — Ambulatory Visit (INDEPENDENT_AMBULATORY_CARE_PROVIDER_SITE_OTHER): Payer: BC Managed Care – PPO | Admitting: Obstetrics & Gynecology

## 2014-01-21 ENCOUNTER — Encounter: Payer: Self-pay | Admitting: Obstetrics & Gynecology

## 2014-01-21 VITALS — BP 145/84 | HR 95 | Resp 16 | Ht 67.5 in | Wt 143.0 lb

## 2014-01-21 DIAGNOSIS — D251 Intramural leiomyoma of uterus: Secondary | ICD-10-CM

## 2014-01-27 NOTE — Progress Notes (Signed)
   Subjective:    Patient ID: [name], female    DOB: 06/24/1966, 48 y.o.   MRN: [id number]  HPI  48 yo AA lady who had an u/s done recently that showed small fibroids and a 5 mm endometrial thickness. The u/s was done for 2 week h/o right pelvic pain. Her pain has resolved. Her bleeding is normal.  Review of Systems     Objective:   Physical Exam        Assessment & Plan:  Asymptomatic fibroids found incidentally on u/s- watchful waiting Preventative- schedule mammogram

## 2014-02-02 ENCOUNTER — Ambulatory Visit (INDEPENDENT_AMBULATORY_CARE_PROVIDER_SITE_OTHER): Payer: BC Managed Care – PPO

## 2014-02-02 DIAGNOSIS — Z0000 Encounter for general adult medical examination without abnormal findings: Secondary | ICD-10-CM

## 2014-02-02 DIAGNOSIS — Z1231 Encounter for screening mammogram for malignant neoplasm of breast: Secondary | ICD-10-CM

## 2014-02-09 ENCOUNTER — Ambulatory Visit (INDEPENDENT_AMBULATORY_CARE_PROVIDER_SITE_OTHER): Payer: BC Managed Care – PPO | Admitting: Family Medicine

## 2014-02-09 ENCOUNTER — Encounter: Payer: Self-pay | Admitting: Family Medicine

## 2014-02-09 VITALS — BP 140/84 | HR 104 | Ht 67.5 in | Wt 146.0 lb

## 2014-02-09 DIAGNOSIS — I471 Supraventricular tachycardia, unspecified: Secondary | ICD-10-CM | POA: Diagnosis not present

## 2014-02-09 DIAGNOSIS — D500 Iron deficiency anemia secondary to blood loss (chronic): Secondary | ICD-10-CM

## 2014-02-09 DIAGNOSIS — J45909 Unspecified asthma, uncomplicated: Secondary | ICD-10-CM

## 2014-02-09 DIAGNOSIS — J4520 Mild intermittent asthma, uncomplicated: Secondary | ICD-10-CM

## 2014-02-09 DIAGNOSIS — Z111 Encounter for screening for respiratory tuberculosis: Secondary | ICD-10-CM

## 2014-02-09 DIAGNOSIS — Z23 Encounter for immunization: Secondary | ICD-10-CM | POA: Diagnosis not present

## 2014-02-09 DIAGNOSIS — Z2839 Other underimmunization status: Secondary | ICD-10-CM

## 2014-02-09 DIAGNOSIS — Z283 Underimmunization status: Secondary | ICD-10-CM

## 2014-02-09 MED ORDER — METOPROLOL SUCCINATE ER 25 MG PO TB24: 25.0000 mg | ORAL_TABLET | Freq: Every day | ORAL | Status: DC

## 2014-02-09 NOTE — Progress Notes (Signed)
CC: [name] is a 48 y.o. female is here for complete form for work   Subjective: HPI:  Followup anemia: Since I saw her last she has had regular predictable menstrual cycles continues to take iron one to 2 times a day. States that her energy level has been contained and is currently"Great" she is able to workout on a daily basis without any fatigue shortness of breath or chest pain. Denies any rectal bleeding or any other bruising or bleeding abnormalities  Followup paroxysmal supraventricular tachycardia: Since starting metoprolol she's had no irregular heartbeat, racing heart beat nor any chest discomfort. Denies motor sensory disturbances. Denies orthostatic like symptoms since taking metoprolol nor any known side effects from the medication.  Followup asthma: Since I saw him last he's been diagnosed with asthma from allergies partners at [place]. She's currently taking Advair and Singulair without any cough, wheezing, nor shortness of breath. She has followup with her allergy specialist next month to determine if they can start weaning off of medications. She has not needed albuterol since seeing me allergy specialist a few months ago.  She has paperwork to be filled out for [facility], she's deficient on tetanus booster and pertussis booster, she needs a PPD.  Review Of Systems Outlined In HPI  Past Medical History  Diagnosis Date  . Irregular menstrual bleeding 02/27/2011  . Esophageal stricture 12/16/2012    Dilated Spring 2014   . Tachycardia     Past Surgical History  Procedure Laterality Date  . Cesarean section      x 2 last in 99  . Tubal ligation  2000   Family History  Problem Relation Age of Onset  . Diabetes Mother   . Hypertension Mother     History   Social History  . Marital Status: Married    Spouse Name: N/A    Number of Children: N/A  . Years of Education: N/A   Occupational History  . academic Scientist, physiological of [employer]    Social  History Main Topics  . Smoking status: Never Smoker   . Smokeless tobacco: Never Used  . Alcohol Use: Yes     Comment: occasionally  . Drug Use: No  . Sexual Activity: Yes    Partners: Male    Birth Control/ Protection: Surgical   Other Topics Concern  . Not on file   Social History Narrative  . No narrative on file     Objective: BP 140/84  Pulse 104  Ht 5' 7.5" (1.715 m)  Wt 146 lb (66.225 kg)  BMI 22.52 kg/m2  LMP 01/25/2014  General: Alert and Oriented, No Acute Distress HEENT: Pupils equal, round, reactive to light. Conjunctivae clear.   moist membranes pharynx unremarkable. Hearing is grossly intact bilaterally  Lungs: Clear to auscultation bilaterally, no wheezing/ronchi/rales.  Comfortable work of breathing. Good air movement. Cardiac: Regular rate and rhythm. Normal S1/S2.  No murmurs, rubs, nor gallops.   Extremities: No peripheral edema.  Strong peripheral pulses.  Mental Status: No depression, anxiety, nor agitation. Skin: Warm and dry.  Assessment & Plan: [name] was seen today for complete form for work.  Diagnoses and associated orders for this visit:  Iron deficiency anemia due to chronic blood loss  Paroxysmal supraventricular tachycardia - metoprolol succinate (TOPROL-XL) 25 MG 24 hr tablet; Take 1 tablet (25 mg total) by mouth daily.  Asthma, chronic, mild intermittent, uncomplicated  Immunization deficiency - Tdap vaccine greater than or equal to 7yo IM - PPD  Iron deficiency anemia: Clinically controlled additionally she had a CBC obtained last month which showed a normal hemoglobin. Continue daily iron. 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

## 2014-02-11 ENCOUNTER — Other Ambulatory Visit: Payer: Self-pay | Admitting: *Deleted

## 2014-02-11 DIAGNOSIS — J309 Allergic rhinitis, unspecified: Secondary | ICD-10-CM

## 2014-02-11 LAB — TB SKIN TEST
Induration: 0 mm
TB Skin Test: NEGATIVE

## 2014-02-11 MED ORDER — MONTELUKAST SODIUM 10 MG PO TABS: 10.0000 mg | ORAL_TABLET | Freq: Every day | ORAL | Status: DC

## 2014-02-16 ENCOUNTER — Telehealth: Payer: Self-pay | Admitting: *Deleted

## 2014-02-16 MED ORDER — BECLOMETHASONE DIPROPIONATE 80 MCG/ACT NA AERS: INHALATION_SPRAY | NASAL | Status: DC

## 2014-02-16 NOTE — Telephone Encounter (Signed)
Pt requests a sample of qnasl.one sample left up front and pt notified

## 2014-02-20 ENCOUNTER — Encounter: Payer: Self-pay | Admitting: Emergency Medicine

## 2014-02-20 ENCOUNTER — Emergency Department
Admission: EM | Admit: 2014-02-20 | Discharge: 2014-02-20 | Disposition: A | Discharge status: Home / Self Care | Payer: BC Managed Care – PPO | Source: Home / Self Care | Attending: Emergency Medicine | Admitting: Emergency Medicine

## 2014-02-20 DIAGNOSIS — J0101 Acute recurrent maxillary sinusitis: Secondary | ICD-10-CM

## 2014-02-20 DIAGNOSIS — J0100 Acute maxillary sinusitis, unspecified: Secondary | ICD-10-CM

## 2014-02-20 MED ORDER — METHYLPREDNISOLONE ACETATE 80 MG/ML IJ SUSP
80.0000 mg | Freq: Once | INTRAMUSCULAR | Status: AC
  Administered 2014-02-20: 80 mg via INTRAMUSCULAR

## 2014-02-20 MED ORDER — PREDNISONE (PAK) 10 MG PO TABS: ORAL_TABLET | ORAL | Status: DC

## 2014-02-20 MED ORDER — CEFTRIAXONE SODIUM 1 G IJ SOLR
1.0000 g | INTRAMUSCULAR | Status: AC
  Administered 2014-02-20: 1 g via INTRAMUSCULAR

## 2014-02-20 MED ORDER — AMOXICILLIN-POT CLAVULANATE 875-125 MG PO TABS: 1.0000 | ORAL_TABLET | Freq: Two times a day (BID) | ORAL | Status: DC

## 2014-02-20 NOTE — ED Notes (Signed)
Pt complains of nasal congestion, R ear pain, headaches described as pressure.  Started 6 days ago and progressively has gotten worse.

## 2014-02-20 NOTE — ED Provider Notes (Signed)
CSN: [id number]     Arrival date & time 02/20/14  1435 History   First MD Initiated Contact with Patient 02/20/14 1436     Chief Complaint  Patient presents with  . Sinusitis    HPI URI HISTORY  [name] is a 48 y.o. female who complains of progressively worsening sinus symptoms and right ear pressure for 6 days.  Has been taking all of her prescribed medication prescribed by allergist and PCP  + mild chills/sweats +  Fever  +  Nasal congestion +  Discolored Post-nasal drainage + sinus pain/pressure.--She even feels pressure and pain into her upper teeth No sore throat + Sneezing  No cough Minimal, occasional wheezing No chest congestion No hemoptysis No shortness of breath No pleuritic pain  + mild itchy/red eyes No earache  No nausea No vomiting No abdominal pain No diarrhea  No skin rashes +  Fatigue No myalgias Positive for sinus headache. No focal neurologic symptoms   Past Medical History  Diagnosis Date  . Irregular menstrual bleeding 02/27/2011  . Esophageal stricture 12/16/2012    Dilated Spring 2014   . Tachycardia    Past Surgical History  Procedure Laterality Date  . Cesarean section      x 2 last in 99  . Tubal ligation  2000   Family History  Problem Relation Age of Onset  . Diabetes Mother   . Hypertension Mother    History  Substance Use Topics  . Smoking status: Never Smoker   . Smokeless tobacco: Never Used  . Alcohol Use: Yes     Comment: occasionally   OB History   Grav Para Term Preterm Abortions TAB SAB Ect Mult Living   2 2        2      Review of Systems  All other systems reviewed and are negative.   Allergies  Review of patient's allergies indicates no known allergies.  Home Medications   Prior to Admission medications   Medication Sig Start Date End Date Taking? Authorizing Provider  amoxicillin-clavulanate (AUGMENTIN) 875-125 MG per tablet Take 1 tablet by mouth 2 (two) times daily. For 10 days. Take with food.  (Start taking today-Saturday) 02/20/14   Jacqulyn Cane, MD  Beclomethasone Dipropionate (QNASL) 80 MCG/ACT AERS Two puffs each nostril daily. 02/16/14   Sean Hommel, DO  Fluticasone-Salmeterol (ADVAIR DISKUS) 100-50 MCG/DOSE AEPB Inhale 1 puff into the lungs 2 (two) times daily. 10/14/13   Kandra Nicolas, MD  metoprolol succinate (TOPROL-XL) 25 MG 24 hr tablet Take 1 tablet (25 mg total) by mouth daily. 02/09/14   Sean Hommel, DO  Mometasone Furo-Formoterol Fum (DULERA IN) Inhale into the lungs.    Historical Provider, MD  montelukast (SINGULAIR) 10 MG tablet Take 1 tablet (10 mg total) by mouth at bedtime. 02/11/14   Sean Hommel, DO  naproxen (NAPROSYN) 500 MG tablet Take 1 tablet (500 mg total) by mouth 2 (two) times daily. 0/3/47   Delora Fuel, MD  predniSONE (STERAPRED UNI-PAK) 10 MG tablet Take as directed for 6 days.-Take 6 on day 1(Sunday), 5 on day 2, 4 on day 3, then 3 tablets on day 4, then 2 on day 5, then 1 on day 6. 02/20/14   Jacqulyn Cane, MD   BP 144/89  Pulse 104  Temp(Src) 99 F (37.2 C) (Oral)  Ht 5' 7.5" (1.715 m)  Wt 146 lb 8 oz (66.452 kg)  BMI 22.59 kg/m2  SpO2 100%  LMP 02/20/2014 Physical Exam  Nursing note and  vitals reviewed. Constitutional: She is oriented to person, place, and time. She appears well-developed and well-nourished. No distress.  HENT:  Head: Normocephalic and atraumatic.  Right Ear: External ear and ear canal normal. Tympanic membrane is erythematous (minimal injection). Tympanic membrane is not perforated and not bulging. A middle ear effusion is present.  Left Ear: Tympanic membrane, external ear and ear canal normal.  Nose: Mucosal edema and rhinorrhea present. Right sinus exhibits maxillary sinus tenderness. Left sinus exhibits maxillary sinus tenderness.  Mouth/Throat: Oropharynx is clear and moist. No oral lesions. No oropharyngeal exudate.  Eyes: Right eye exhibits no discharge. Left eye exhibits no discharge. No scleral icterus.  Neck: Neck supple.   Cardiovascular: Normal rate, regular rhythm and normal heart sounds.   Pulmonary/Chest: Effort normal and breath sounds normal. She has no wheezes. She has no rales.  Lungs are clear except 1 faint wheeze heard anteriorly on forced expiration only.  Lymphadenopathy:    She has no cervical adenopathy.  Neurological: She is alert and oriented to person, place, and time.  Skin: Skin is warm and dry. No rash noted.    ED Course  Procedures (including critical care time) Labs Review Labs Reviewed - No data to display  Imaging Review No results found.   MDM   1. Acute recurrent maxillary sinusitis   Severe. With allergic component Slight asthma flair.  Treatment options discussed, as well as risks, benefits, alternatives. Patient voiced understanding and agreement with the following plans: Rocephin 1 g IM stat Augmentin 875 twice a day x10 days. Depo-Medrol 80 mg IM stat Starting tomorrow, begin prednisone 10 mg-6 day Dosepak  Continue other prescriptions as prescribed by PCP and allergist. Other nonpharmacologic measures discussed.  Follow-up with your primary care doctor or allergist in 5-7 days if not improving, or sooner if symptoms become worse. Precautions discussed. Red flags discussed. Questions invited and answered. Patient voiced understanding and agreement.        Jacqulyn Cane, MD 02/20/14 1539

## 2014-02-25 ENCOUNTER — Telehealth: Payer: Self-pay | Admitting: *Deleted

## 2014-02-25 NOTE — Telephone Encounter (Signed)
Pt called and left a message that she was dx with a sinus infection. Pt states she needs an allergy medication to take along with the other rx'ed medications. Called pt and advised she can take zyrtec along with other medications including the singulair. Call back on Monday if no improvement.

## 2014-02-26 ENCOUNTER — Ambulatory Visit (INDEPENDENT_AMBULATORY_CARE_PROVIDER_SITE_OTHER): Payer: BC Managed Care – PPO | Admitting: Family Medicine

## 2014-02-26 ENCOUNTER — Encounter: Payer: Self-pay | Admitting: Family Medicine

## 2014-02-26 VITALS — BP 144/106 | HR 106 | Temp 97.8°F | Wt 146.0 lb

## 2014-02-26 DIAGNOSIS — J4520 Mild intermittent asthma, uncomplicated: Secondary | ICD-10-CM

## 2014-02-26 DIAGNOSIS — J45909 Unspecified asthma, uncomplicated: Secondary | ICD-10-CM | POA: Diagnosis not present

## 2014-02-26 DIAGNOSIS — J321 Chronic frontal sinusitis: Secondary | ICD-10-CM | POA: Diagnosis not present

## 2014-02-26 MED ORDER — LEVOFLOXACIN 500 MG PO TABS: 500.0000 mg | ORAL_TABLET | Freq: Every day | ORAL | Status: DC

## 2014-02-26 MED ORDER — PREDNISONE 20 MG PO TABS: ORAL_TABLET | ORAL | Status: AC

## 2014-02-26 MED ORDER — METHYLPREDNISOLONE SODIUM SUCC 125 MG IJ SOLR
125.0000 mg | Freq: Once | INTRAMUSCULAR | Status: AC
  Administered 2014-02-26: 125 mg via INTRAMUSCULAR

## 2014-02-26 MED ORDER — HYDROCODONE-HOMATROPINE 5-1.5 MG/5ML PO SYRP: 5.0000 mL | ORAL_SOLUTION | Freq: Three times a day (TID) | ORAL | Status: DC | PRN

## 2014-02-26 NOTE — Progress Notes (Signed)
CC: [name] is a 48 y.o. female is here for Sinusitis?   Subjective: HPI:  Complains of facial pressure localized in the eyes and nasal congestion with postnasal drip that has been present for 2 weeks now. Last week and she received a gram of Rocephin, and injection of corticosteroid, and started on Augmentin. She reports mild improvement over the first 24 hours after this intervention but back to baseline of moderate to severe in severity. Symptoms are worse in the evening and significantly interfering with sleep but overall persistent throughout the day. Accompanied by mild cough. Symptoms do not seem to be getting better or worse since Monday. Denies shortness of breath, wheezing, motor or sensory disturbances, headache, fevers, chills, nausea or chest pain   Review Of Systems Outlined In HPI  Past Medical History  Diagnosis Date  . Irregular menstrual bleeding 02/27/2011  . Esophageal stricture 12/16/2012    Dilated Spring 2014   . Tachycardia     Past Surgical History  Procedure Laterality Date  . Cesarean section      x 2 last in 99  . Tubal ligation  2000   Family History  Problem Relation Age of Onset  . Diabetes Mother   . Hypertension Mother     History   Social History  . Marital Status: Married    Spouse Name: N/A    Number of Children: N/A  . Years of Education: N/A   Occupational History  . 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 Smoking status: Never Smoker   . Smokeless tobacco: Never Used  . Alcohol Use: Yes     Comment: occasionally  . Drug Use: No  . Sexual Activity: Yes    Partners: Male    Birth Control/ Protection: Surgical   Other Topics Concern  . Not on file   Social History Narrative  . No narrative on file     Objective: BP 144/106  Pulse 106  Temp(Src) 97.8 F (36.6 C) (Oral)  Wt 146 lb (66.225 kg)  LMP 02/20/2014  General: Alert and Oriented, No Acute Distress HEENT: Pupils equal, round,  reactive to light. Conjunctivae clear.  External ears unremarkable, canals clear with intact TMs with appropriate landmarks.  Middle ear appears open without effusion. Boggy erythematous center turbinates with moderate mucoid discharge.  Moist mucous membranes, pharynx without inflammation nor lesions.  Neck supple without palpable lymphadenopathy nor abnormal masses. Lungs: Distant breath sounds without wheezing rhonchi nor rales. Cardiac: Regular rate and rhythm. Normal S1/S2.  No murmurs, rubs, nor gallops.   Extremities: No peripheral edema.  Strong peripheral pulses.  Mental Status: No depression, anxiety, nor agitation. Skin: Warm and dry.  Assessment & Plan: [name] was seen today for sinusitis?.  Diagnoses and associated orders for this visit:  Asthma, chronic, mild intermittent, uncomplicated - methylPREDNISolone sodium succinate (SOLU-MEDROL) 125 mg/2 mL injection 125 mg; Inject 2 mLs (125 mg total) into the muscle once.  Frontal sinusitis, unspecified chronicity - predniSONE (DELTASONE) 20 MG tablet; Three tabs at once daily for five days. - levofloxacin (LEVAQUIN) 500 MG tablet; Take 1 tablet (500 mg total) by mouth daily. - HYDROcodone-homatropine (HYCODAN) 5-1.5 MG/5ML syrup; Take 5 mLs by mouth every 8 (eight) hours as needed for cough (Will cause sedation.). - methylPREDNISolone sodium succinate (SOLU-MEDROL) 125 mg/2 mL injection 125 mg; Inject 2 mLs (125 mg total) into the muscle once.    Frontal sinusitis: Start prednisone burst, levofloxacin. Stop Augmentin. Hycodan as needed  to help with sleep. She also received 125 mg of Solu-Medrol today.Signs and symptoms requring emergent/urgent reevaluation were discussed with the patient. Fortunately does not appear that this is affecting her asthma  No Follow-up on file.

## 2014-03-01 ENCOUNTER — Telehealth: Payer: Self-pay

## 2014-03-01 DIAGNOSIS — J0100 Acute maxillary sinusitis, unspecified: Secondary | ICD-10-CM

## 2014-03-01 NOTE — Telephone Encounter (Signed)
Seth Bake, Will you please relay that a urgent ENT referral has been placed.

## 2014-03-01 NOTE — Telephone Encounter (Signed)
Message left on vm 

## 2014-03-01 NOTE — Telephone Encounter (Signed)
[name] reports she still has congestion, cough and runny nose. She is concerned that she is not getting better. She is taking all the medications as directed. Please advise.

## 2014-03-08 ENCOUNTER — Telehealth: Payer: Self-pay | Admitting: *Deleted

## 2014-03-08 DIAGNOSIS — J321 Chronic frontal sinusitis: Secondary | ICD-10-CM

## 2014-03-08 MED ORDER — HYDROCODONE-HOMATROPINE 5-1.5 MG/5ML PO SYRP: 5.0000 mL | ORAL_SOLUTION | Freq: Three times a day (TID) | ORAL | Status: DC | PRN

## 2014-03-08 NOTE — Telephone Encounter (Signed)
Ok per Dr. Ileene Rubens

## 2014-03-08 NOTE — Telephone Encounter (Signed)
Pt called and requests a refill on cough medication. Hycodan last rx'ed 8/14. Please advise

## 2014-03-19 ENCOUNTER — Telehealth: Payer: Self-pay | Admitting: *Deleted

## 2014-03-19 DIAGNOSIS — J321 Chronic frontal sinusitis: Secondary | ICD-10-CM

## 2014-03-19 MED ORDER — TRAZODONE HCL 50 MG PO TABS: 50.0000 mg | ORAL_TABLET | Freq: Every evening | ORAL | Status: DC | PRN

## 2014-03-19 MED ORDER — HYDROCODONE-HOMATROPINE 5-1.5 MG/5ML PO SYRP: 5.0000 mL | ORAL_SOLUTION | Freq: Three times a day (TID) | ORAL | Status: DC | PRN

## 2014-03-19 NOTE — Telephone Encounter (Signed)
Pt has been notified and rx has been faxed

## 2014-03-19 NOTE — Telephone Encounter (Signed)
[name], Rx placed in in-box ready for pickup/faxing.  

## 2014-03-19 NOTE — Telephone Encounter (Signed)
Pt requests medication to help her sleep. She is having a hard time getting rest due to her sinus/allergy issues. She is scheduled to have a consult to have sinus polyps removed next week.Pt also requests a cough medication.Please advise

## 2014-03-23 ENCOUNTER — Telehealth: Payer: Self-pay | Admitting: Family Medicine

## 2014-03-23 DIAGNOSIS — J321 Chronic frontal sinusitis: Secondary | ICD-10-CM

## 2014-03-23 MED ORDER — HYDROCODONE-HOMATROPINE 5-1.5 MG/5ML PO SYRP: 5.0000 mL | ORAL_SOLUTION | Freq: Three times a day (TID) | ORAL | Status: DC | PRN

## 2014-03-23 NOTE — Telephone Encounter (Signed)
Reprint

## 2014-03-25 ENCOUNTER — Encounter: Payer: Self-pay | Admitting: Family Medicine

## 2014-03-25 DIAGNOSIS — J339 Nasal polyp, unspecified: Secondary | ICD-10-CM | POA: Insufficient documentation

## 2014-04-05 ENCOUNTER — Encounter: Payer: BC Managed Care – PPO | Admitting: Family Medicine

## 2014-04-06 ENCOUNTER — Encounter: Payer: Self-pay | Admitting: Family Medicine

## 2014-04-06 ENCOUNTER — Ambulatory Visit (INDEPENDENT_AMBULATORY_CARE_PROVIDER_SITE_OTHER): Payer: BC Managed Care – PPO | Admitting: Family Medicine

## 2014-04-06 VITALS — BP 147/96 | HR 108 | Wt 136.0 lb

## 2014-04-06 DIAGNOSIS — K649 Unspecified hemorrhoids: Secondary | ICD-10-CM

## 2014-04-06 DIAGNOSIS — K648 Other hemorrhoids: Secondary | ICD-10-CM

## 2014-04-06 NOTE — Progress Notes (Signed)
CC: [name] is a 48 y.o. female is here for piles   Subjective: HPI:  Complains of bright red blood noticed in her stool 3 days ago it happened only once and is no longer present. It occurred in the setting of her taking oxycodone on a daily basis at night to help with pain from a recent sinus surgery. She's also noticed that her bowel movements are slower to move and take a little more straining. She denies any recent or remote rectal pain or masses.  She denies any change in the caliber of her stool nor any melena. She had been taking a stool softener for the past week however stopped about 4-5 days ago after she was prescribed an antibiotic by her allergist and was told that diarrhea could be a potential side effect.  Denies abdominal pain, pelvic pain, rectal pain, nor any other bleeding or bruising abnormalities.   Review Of Systems Outlined In HPI  Past Medical History  Diagnosis Date  . Irregular menstrual bleeding 02/27/2011  . Esophageal stricture 12/16/2012    Dilated Spring 2014   . Tachycardia     Past Surgical History  Procedure Laterality Date  . Cesarean section      x 2 last in 99  . Tubal ligation  2000   Family History  Problem Relation Age of Onset  . Diabetes Mother   . Hypertension Mother     History   Social History  . Marital Status: Married    Spouse Name: N/A    Number of Children: N/A  . Years of Education: N/A   Occupational History  . 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 Smoking status: Never Smoker   . Smokeless tobacco: Never Used  . Alcohol Use: Yes     Comment: occasionally  . Drug Use: No  . Sexual Activity: Yes    Partners: Male    Birth Control/ Protection: Surgical   Other Topics Concern  . Not on file   Social History Narrative  . No narrative on file     Objective: BP 147/96  Pulse 108  Wt 136 lb (61.689 kg)  General: Alert and Oriented, No Acute Distress HEENT: Pupils equal,  round, reactive to light. Conjunctivae clear.  External ears unremarkable, canals clear with intact TMs with appropriate landmarks.  Middle ear appears open without effusion. Pink inferior turbinates.  Moist mucous membranes, pharynx without inflammation nor lesions.  Neck supple without palpable lymphadenopathy nor abnormal masses. Extremities: No peripheral edema.  Strong peripheral pulses.  Mental Status: No depression, anxiety, nor agitation. Skin: Warm and dry.  Assessment & Plan: Margart was seen today for piles.  Diagnoses and associated orders for this visit:  Other hemorrhoids    Symptoms in the setting of straining to defecate while taking daily oxycodone highly suggestive of internal hemorrhoids. I encouraged her to restart taking docusate at least Once a day preferably at night. Increase fiber in the diet with fruit such as pineapple, peaches, pears, et Ronney Asters. I've asked her to provide me with stool cards one week after she stops taking oxycodone. Signs and symptoms requring emergent/urgent reevaluation were discussed with the patient.    Return if symptoms worsen or fail to improve.

## 2014-05-17 ENCOUNTER — Encounter: Payer: Self-pay | Admitting: Family Medicine

## 2014-06-23 ENCOUNTER — Encounter: Payer: Self-pay | Admitting: *Deleted

## 2014-06-23 ENCOUNTER — Emergency Department
Admission: EM | Admit: 2014-06-23 | Discharge: 2014-06-23 | Disposition: A | Discharge status: Home / Self Care | Payer: BC Managed Care – PPO | Source: Home / Self Care | Attending: Family Medicine | Admitting: Family Medicine

## 2014-06-23 DIAGNOSIS — J310 Chronic rhinitis: Secondary | ICD-10-CM

## 2014-06-23 DIAGNOSIS — J069 Acute upper respiratory infection, unspecified: Secondary | ICD-10-CM

## 2014-06-23 DIAGNOSIS — B9789 Other viral agents as the cause of diseases classified elsewhere: Principal | ICD-10-CM

## 2014-06-23 DIAGNOSIS — J9801 Acute bronchospasm: Secondary | ICD-10-CM

## 2014-06-23 MED ORDER — AZITHROMYCIN 250 MG PO TABS: ORAL_TABLET | ORAL | Status: DC

## 2014-06-23 MED ORDER — BENZONATATE 200 MG PO CAPS: 200.0000 mg | ORAL_CAPSULE | Freq: Every day | ORAL | Status: DC

## 2014-06-23 MED ORDER — IPRATROPIUM-ALBUTEROL 0.5-2.5 (3) MG/3ML IN SOLN
3.0000 mL | Freq: Once | RESPIRATORY_TRACT | Status: AC
  Administered 2014-06-23: 3 mL via RESPIRATORY_TRACT

## 2014-06-23 NOTE — Discharge Instructions (Signed)
Take plain Mucinex (1200 mg guaifenesin) twice daily for cough and congestion.  May add Sudafed for sinus congestion.   Increase fluid intake, rest. Continue inhalers as prescribed Stop all antihistamines for now, and other non-prescription cough/cold preparations.   Recommend followup appointment with cardiologist

## 2014-06-23 NOTE — ED Notes (Signed)
Pt c/o productive cough with rattling in her chest, runny nose, nasal congestion, and RT ear ache x 5 days. Denies fever.

## 2014-06-23 NOTE — ED Provider Notes (Signed)
CSN: [id number]     Arrival date & time 06/23/14  1444 History   First MD Initiated Contact with Patient 06/23/14 1454     Chief Complaint  Patient presents with  . Cough      HPI Comments: Patient reports that she has had recurring cough for four months.  Over the past six days she has  Developed increased sinus congestion, post-nasal drainage, cough, mild sore throat, headache, sweats, wheezing, and shortness of breath with activity.  She has been using her Symbicort twice daily, and her ProAir daily.                                                                                                                                             The history is provided by the patient.    Past Medical History  Diagnosis Date  . Irregular menstrual bleeding 02/27/2011  . Esophageal stricture 12/16/2012    Dilated Spring 2014   . Tachycardia    Past Surgical History  Procedure Laterality Date  . Cesarean section      x 2 last in 99  . Tubal ligation  2000  . Nasal polyp surgery     Family History  Problem Relation Age of Onset  . Diabetes Mother   . Hypertension Mother    History  Substance Use Topics  . Smoking status: Never Smoker   . Smokeless tobacco: Never Used  . Alcohol Use: No     Comment: occasionally   OB History    Gravida Para Term Preterm AB TAB SAB Ectopic Multiple Living   2 2        2      Review of Systems + sore throat + hoarse + cough No pleuritic pain + wheezing + nasal congestion + post-nasal drainage No sinus pain/pressure No itchy/red eyes ? earache No hemoptysis + SOB No fever, + chills/sweats No nausea No vomiting No abdominal pain No diarrhea No urinary symptoms No skin rash + fatigue No myalgias + headache Used OTC meds without relief  Allergies  Amoxicillin  Home Medications   Prior to Admission medications   Medication Sig Start Date End Date Taking? Authorizing Provider  azithromycin (ZITHROMAX Z-PAK) 250 MG tablet Take 2  tabs today; then begin one tab once daily for 4 more days. 06/23/14   Kandra Nicolas, MD  benzonatate (TESSALON) 200 MG capsule Take 1 capsule (200 mg total) by mouth at bedtime. Take as needed for cough 06/23/14   Kandra Nicolas, MD  Fluticasone-Salmeterol (ADVAIR DISKUS) 100-50 MCG/DOSE AEPB Inhale 1 puff into the lungs 2 (two) times daily. 10/14/13   Kandra Nicolas, MD  metoprolol succinate (TOPROL-XL) 25 MG 24 hr tablet Take 1 tablet (25 mg total) by mouth daily. 02/09/14   Sean Hommel, DO  Mometasone Furo-Formoterol Fum (DULERA IN) Inhale into the lungs.    Historical  Provider, MD  montelukast (SINGULAIR) 10 MG tablet Take 1 tablet (10 mg total) by mouth at bedtime. 02/11/14   Sean Hommel, DO   BP 145/100 mmHg  Pulse 112  Temp(Src) 98.8 F (37.1 C) (Oral)  Resp 16  Ht 5' 7.5" (1.715 m)  Wt 140 lb (63.504 kg)  BMI 21.59 kg/m2  SpO2 96%  LMP 06/19/2014 Physical Exam Nursing notes and Vital Signs reviewed. Appearance:  Patient appears healthy, stated age, and in no acute distress Eyes:  Pupils are equal, round, and reactive to light and accomodation.  Extraocular movement is intact.  Conjunctivae are not inflamed  Ears:  Canals normal.  Tympanic membranes normal.  Nose:   Congested turbinates.  No sinus tenderness.   Pharynx:  Normal Neck:  Supple.  Slightly tender shotty posterior nodes are palpated bilaterally  Lungs:  Diffuse expiratory wheezes posteriorly.   Breath sounds are equal.                                              Heart:  Regular rate and rhythm without murmurs, rubs, or gallops.  Abdomen:  Nontender without masses or hepatosplenomegaly.  Bowel sounds are present.  No CVA or flank tenderness.  Extremities:  No edema.  No calf tenderness Skin:  No rash present.    ED Course  Procedures  none   MDM   1. Viral URI with cough   2. Bronchospasm, acute   3. Perennial non-allergic rhinitis    Duoneb administered in office; note mild decreased in wheezing. Begin Z-pack  to cover atypicals.  Prescription written for Benzonatate [facility]) to take at bedtime for night-time cough.  Take plain Mucinex (1200 mg guaifenesin) twice daily for cough and congestion.  May add Sudafed for sinus congestion.   Increase fluid intake, rest. Continue inhalers as prescribed Stop all antihistamines for now, and other non-prescription cough/cold preparations.   Recommend followup appointment with cardiologist; concerned that metoprolol, although Beta selective, may still cause significant decrease in FEV1 and attenuate response to Beta2 bronchodilator.    Kandra Nicolas, MD 06/25/14 [phone]

## 2014-06-26 ENCOUNTER — Telehealth: Payer: Self-pay | Admitting: Emergency Medicine

## 2015-01-05 ENCOUNTER — Other Ambulatory Visit: Payer: Self-pay | Admitting: Family Medicine

## 2015-01-05 DIAGNOSIS — Z1239 Encounter for other screening for malignant neoplasm of breast: Secondary | ICD-10-CM

## 2015-02-09 ENCOUNTER — Ambulatory Visit (INDEPENDENT_AMBULATORY_CARE_PROVIDER_SITE_OTHER): Payer: BLUE CROSS/BLUE SHIELD

## 2015-02-09 DIAGNOSIS — Z1231 Encounter for screening mammogram for malignant neoplasm of breast: Secondary | ICD-10-CM

## 2015-02-09 DIAGNOSIS — Z1239 Encounter for other screening for malignant neoplasm of breast: Secondary | ICD-10-CM

## 2015-02-19 ENCOUNTER — Other Ambulatory Visit: Payer: Self-pay | Admitting: Family Medicine

## 2015-02-24 ENCOUNTER — Ambulatory Visit (INDEPENDENT_AMBULATORY_CARE_PROVIDER_SITE_OTHER): Payer: BLUE CROSS/BLUE SHIELD | Admitting: Obstetrics & Gynecology

## 2015-02-24 ENCOUNTER — Encounter: Payer: Self-pay | Admitting: Obstetrics & Gynecology

## 2015-02-24 VITALS — BP 130/83 | HR 78 | Resp 16 | Ht 68.0 in | Wt 147.0 lb

## 2015-02-24 DIAGNOSIS — Z1151 Encounter for screening for human papillomavirus (HPV): Secondary | ICD-10-CM

## 2015-02-24 DIAGNOSIS — Z124 Encounter for screening for malignant neoplasm of cervix: Secondary | ICD-10-CM | POA: Diagnosis not present

## 2015-02-24 DIAGNOSIS — Z0000 Encounter for general adult medical examination without abnormal findings: Secondary | ICD-10-CM

## 2015-02-24 DIAGNOSIS — Z01419 Encounter for gynecological examination (general) (routine) without abnormal findings: Secondary | ICD-10-CM

## 2015-02-24 NOTE — Progress Notes (Signed)
Subjective:    [name] is a 49 y.o.  MW P2 (24 and 71 yo daughters) female who presents for an annual exam. The patient has no complaints today. The patient is sexually active. GYN screening history: last pap: was normal. The patient wears seatbelts: yes. The patient participates in regular exercise: yes. Has the patient ever been transfused or tattooed?: no. The patient reports that there is not domestic violence in her life.   Menstrual History: OB History    Gravida Para Term Preterm AB TAB SAB Ectopic Multiple Living   2 2        2       Menarche age: 11  Patient's last menstrual period was 02/17/2015.    The following portions of the patient's history were reviewed and updated as appropriate: allergies, current medications, past family history, past medical history, past social history, past surgical history and problem list.  Review of Systems A comprehensive review of systems was negative.  Mammogram and colonoscopy   Objective:    BP 130/83 mmHg  Pulse 78  Resp 16  Ht 5\' 8"  (1.727 m)  Wt 147 lb (66.679 kg)  BMI 22.36 kg/m2  LMP 02/17/2015  General Appearance:    Alert, cooperative, no distress, appears stated age  Head:    Normocephalic, without obvious abnormality, atraumatic  Eyes:    PERRL, conjunctiva/corneas clear, EOM's intact, fundi    benign, both eyes  Ears:    Normal TM's and external ear canals, both ears  Nose:   Nares normal, septum midline, mucosa normal, no drainage    or sinus tenderness  Throat:   Lips, mucosa, and tongue normal; teeth and gums normal  Neck:   Supple, symmetrical, trachea midline, no adenopathy;    thyroid:  no enlargement/tenderness/nodules; no carotid   bruit or JVD  Back:     Symmetric, no curvature, ROM normal, no CVA tenderness  Lungs:     Clear to auscultation bilaterally, respirations unlabored  Chest Wall:    No tenderness or deformity   Heart:    Regular rate and rhythm, S1 and S2 normal, no murmur, rub   or gallop   Breast Exam:    No tenderness, masses, or nipple abnormality  Abdomen:     Soft, non-tender, bowel sounds active all four quadrants,    no masses, no organomegaly  Genitalia:    Normal female without lesion, discharge or tenderness, NSSA, NT, mobile, normal adnexal exam     Extremities:   Extremities normal, atraumatic, no cyanosis or edema  Pulses:   2+ and symmetric all extremities  Skin:   Skin color, texture, turgor normal, no rashes or lesions  Lymph nodes:   Cervical, supraclavicular, and axillary nodes normal  Neurologic:   CNII-XII intact, normal strength, sensation and reflexes    throughout  .    Assessment:    Healthy female exam.    Plan:     Breast self exam technique reviewed and patient encouraged to perform self-exam monthly. Thin prep Pap smear. with cotesting

## 2015-02-28 LAB — CYTOLOGY - PAP

## 2015-03-23 ENCOUNTER — Other Ambulatory Visit: Payer: Self-pay | Admitting: Family Medicine

## 2015-03-28 ENCOUNTER — Ambulatory Visit (INDEPENDENT_AMBULATORY_CARE_PROVIDER_SITE_OTHER): Payer: BLUE CROSS/BLUE SHIELD | Admitting: Family Medicine

## 2015-03-28 ENCOUNTER — Encounter: Payer: Self-pay | Admitting: Family Medicine

## 2015-03-28 VITALS — BP 138/81 | HR 92 | Ht 68.0 in | Wt 150.0 lb

## 2015-03-28 DIAGNOSIS — Z0000 Encounter for general adult medical examination without abnormal findings: Secondary | ICD-10-CM | POA: Diagnosis not present

## 2015-03-28 DIAGNOSIS — Z1211 Encounter for screening for malignant neoplasm of colon: Secondary | ICD-10-CM | POA: Diagnosis not present

## 2015-03-28 DIAGNOSIS — H6123 Impacted cerumen, bilateral: Secondary | ICD-10-CM

## 2015-03-28 DIAGNOSIS — H918X3 Other specified hearing loss, bilateral: Secondary | ICD-10-CM

## 2015-03-28 MED ORDER — METOPROLOL SUCCINATE ER 25 MG PO TB24: ORAL_TABLET | ORAL | Status: DC

## 2015-03-28 NOTE — Progress Notes (Signed)
CC: [name] is a 49 y.o. female is here for Annual Exam   Subjective: HPI:  Colonoscopy: she reports that she had this done in the spring and there were no signs of abnormalities she has a clearance for 10 years. Papsmear: Normal this year but needs repeat next summer, transformation zone absent. Mammogram: Normal July   Influenza Vaccine: declined Pneumovax: no current indication Td/Tdap: UTD from 2015 Zoster: (Start 50 yo)  Requesting complete physical exam with her only complaint being decreased hearing in both ears with some pain in the right ear. Symptoms have been present for matter of months. She is unable to remove the wax from either ear anymore.  Review of Systems - General ROS: negative for - chills, fever, night sweats, weight gain or weight loss Ophthalmic ROS: negative for - decreased vision Psychological ROS: negative for - anxiety or depression ENT ROS: negative for -  nasal congestion, tinnitus or allergies Hematological and Lymphatic ROS: negative for - bleeding problems, bruising or swollen lymph nodes Breast ROS: negative Respiratory ROS: no cough, shortness of breath, or wheezing Cardiovascular ROS: no chest pain or dyspnea on exertion Gastrointestinal ROS: no abdominal pain, change in bowel habits, or black or bloody stools Genito-Urinary ROS: negative for - genital discharge, genital ulcers, incontinence or abnormal bleeding from genitals Musculoskeletal ROS: negative for - joint pain or muscle pain Neurological ROS: negative for - headaches or memory loss Dermatological ROS: negative for lumps, mole changes, rash and skin lesion changes  Past Medical History  Diagnosis Date  . Irregular menstrual bleeding 02/27/2011  . Esophageal stricture 12/16/2012    Dilated Spring 2014   . Tachycardia     Past Surgical History  Procedure Laterality Date  . Cesarean section      x 2 last in 99  . Tubal ligation  2000  . Nasal polyp surgery     Family  History  Problem Relation Age of Onset  . Diabetes Mother   . Hypertension Mother     Social History   Social History  . Marital Status: Married    Spouse Name: N/A  . Number of Children: N/A  . Years of Education: N/A   Occupational History  . 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 Smoking status: Never Smoker   . Smokeless tobacco: Never Used  . Alcohol Use: No     Comment: occasionally  . Drug Use: No  . Sexual Activity:    Partners: Male    Birth Control/ Protection: Surgical   Other Topics Concern  . Not on file   Social History Narrative     Objective: BP 138/81 mmHg  Pulse 92  Ht 5\' 8"  (1.727 m)  Wt 147 lb (66.679 kg)  BMI 22.36 kg/m2  General: No Acute Distress HEENT: Atraumatic, normocephalic, conjunctivae normal without scleral icterus.  No nasal discharge, on initial exam there is a cerumen impaction bilaterally. Following lavage and curettage  hearing grossly intact, TMs with good landmarks bilaterally with no middle ear abnormalities, posterior pharynx clear without oral lesions. Neck: Supple, trachea midline, no cervical nor supraclavicular adenopathy. Pulmonary: Clear to auscultation bilaterally without wheezing, rhonchi, nor rales. Cardiac: Regular rate and rhythm.  No murmurs, rubs, nor gallops. No peripheral edema.  2+ peripheral pulses bilaterally. Abdomen: Bowel sounds normal.  No masses.  Non-tender without rebound.  Negative Murphy's sign. MSK: Grossly intact, no signs of weakness.  Full strength throughout upper and lower extremities.  Full  ROM in upper and lower extremities.  No midline spinal tenderness. Neuro: Gait unremarkable, CN II-XII grossly intact.  C5-C6 Reflex 2/4 Bilaterally, L4 Reflex 2/4 Bilaterally.  Cerebellar function intact. Skin: No rashes. Psych: Alert and oriented to person/place/time.  Thought process normal. No anxiety/depression.   Assessment & Plan: [name] was seen today for annual  exam.  Diagnoses and all orders for this visit:  Annual physical exam  Special screening for malignant neoplasms, colon  Hearing loss due to cerumen impaction, bilateral  Healthy lifestyle interventions including but not limited to regular exercise, a healthy low fat diet, moderation of salt intake, the dangers of tobacco/alcohol/recreational drug use, nutrition supplementation, and accident avoidance were discussed with the patient and a handout was provided for future reference.  Indication: Cerumen impaction of the ears Medical necessity statement: On physical examination, cerumen impairs clinically significant portions of the external auditory canal, and tympanic membrane. Noted obstructive, copious cerumen that cannot be removed without magnification and instrumentations requiring physician skills Consent: Discussed benefits and risks of procedure and verbal consent obtained Procedure: Patient was prepped for the procedure. Utilized an otoscope to assess and take note of the ear canal, the tympanic membrane, and the presence, amount, and placement of the cerumen. Gentle water irrigation and soft plastic curette was utilized to remove cerumen.  Post procedure examination: shows cerumen was completely removed. Patient tolerated procedure well. The patient is made aware that they may experience temporary vertigo, temporary hearing loss, and temporary discomfort. If these symptom last for more than 24 hours to call the clinic or proceed to the ED.    Modifier 25  Return if symptoms worsen or fail to improve.

## 2015-04-02 LAB — CBC
HCT: 41.2 % (ref 36.0–46.0)
Hemoglobin: 13.3 g/dL (ref 12.0–15.0)
MCH: 29.1 pg (ref 26.0–34.0)
MCHC: 32.3 g/dL (ref 30.0–36.0)
MCV: 90.2 fL (ref 78.0–100.0)
MPV: 11.3 fL (ref 8.6–12.4)
Platelets: 258 K/uL (ref 150–400)
RBC: 4.57 MIL/uL (ref 3.87–5.11)
RDW: 13.3 % (ref 11.5–15.5)
WBC: 5.6 K/uL (ref 4.0–10.5)

## 2015-04-02 LAB — COMPLETE METABOLIC PANEL WITHOUT GFR
ALT: 15 U/L (ref 6–29)
AST: 15 U/L (ref 10–35)
Albumin: 4 g/dL (ref 3.6–5.1)
Alkaline Phosphatase: 41 U/L (ref 33–130)
BUN: 13 mg/dL (ref 7–25)
CO2: 26 mmol/L (ref 20–31)
Calcium: 9.3 mg/dL (ref 8.6–10.4)
Chloride: 104 mmol/L (ref 98–110)
Creat: 0.71 mg/dL (ref 0.50–1.05)
GFR, Est African American: >89 mL/min
GFR, Est Non African American: >89 mL/min
Glucose, Bld: 90 mg/dL (ref 65–99)
Potassium: 4.4 mmol/L (ref 3.5–5.3)
Sodium: 138 mmol/L (ref 135–146)
Total Bilirubin: 0.4 mg/dL (ref 0.2–1.2)
Total Protein: 6.9 g/dL (ref 6.1–8.1)

## 2015-04-02 LAB — LIPID PANEL
Cholesterol: 189 mg/dL (ref 125–200)
HDL: 74 mg/dL
LDL Cholesterol: 106 mg/dL
Total CHOL/HDL Ratio: 2.6 ratio
Triglycerides: 44 mg/dL
VLDL: 9 mg/dL

## 2015-09-20 ENCOUNTER — Ambulatory Visit: Payer: BLUE CROSS/BLUE SHIELD | Admitting: Obstetrics & Gynecology

## 2015-12-08 ENCOUNTER — Telehealth: Payer: Self-pay | Admitting: *Deleted

## 2015-12-08 MED ORDER — METOPROLOL SUCCINATE ER 25 MG PO TB24: ORAL_TABLET | ORAL | Status: DC

## 2015-12-08 NOTE — Telephone Encounter (Signed)
Will you ask if if she's 100% sure she's taking Dulera, former records reflect that she was taking symbicort?

## 2015-12-08 NOTE — Telephone Encounter (Signed)
Message left on vm 

## 2015-12-08 NOTE — Telephone Encounter (Signed)
[facility]) called and states pt is requesting a refill on Dulera and Toprol XL 25 mg 1 qd. The patient is new to that pharmacy so they don't have any information on this patient. Will send toprol but Ruthe Mannan is noted on historical med list and has never been filled with this office. Have to rout to provider for [facility]

## 2015-12-14 NOTE — Telephone Encounter (Signed)
Message left on vm 

## 2015-12-21 NOTE — Telephone Encounter (Signed)
Closing encounter

## 2016-01-02 ENCOUNTER — Encounter: Payer: Self-pay | Admitting: Family Medicine

## 2016-01-02 ENCOUNTER — Ambulatory Visit (INDEPENDENT_AMBULATORY_CARE_PROVIDER_SITE_OTHER): Payer: BC Managed Care – PPO | Admitting: Family Medicine

## 2016-01-02 VITALS — BP 137/88 | HR 90 | Wt 147.0 lb

## 2016-01-02 DIAGNOSIS — K222 Esophageal obstruction: Secondary | ICD-10-CM | POA: Diagnosis not present

## 2016-01-02 MED ORDER — PANTOPRAZOLE SODIUM 40 MG PO TBEC: 40.0000 mg | DELAYED_RELEASE_TABLET | Freq: Every day | ORAL | Status: DC

## 2016-01-02 NOTE — Progress Notes (Signed)
CC: [name] is a 50 y.o. female is here for Food stuck in throat   Subjective: HPI:  For the past 2 weeks she's been having difficulty with sensation of solid foods getting stuck low in her throat. She denies any choking or regurgitation. Symptoms will last for about 2 hours and are sometimes improved with drinking large amounts of fluids quickly. She had similar symptoms about 3 years ago requiring dilatation of her esophagus. Interventions recently have included Tums but her pharmacist was unsure what else to recommend. She doesn't think symptoms are getting better or worse since onset. She denies any exertional chest pain, shortness of breath, cough or vomiting. Symptoms are mild-to-moderate in severity.   Review Of Systems Outlined In HPI  Past Medical History  Diagnosis Date  . Irregular menstrual bleeding 02/27/2011  . Esophageal stricture 12/16/2012    Dilated Spring 2014   . Tachycardia     Past Surgical History  Procedure Laterality Date  . Cesarean section      x 2 last in 99  . Tubal ligation  2000  . Nasal polyp surgery     Family History  Problem Relation Age of Onset  . Diabetes Mother   . Hypertension Mother     Social History   Social History  . Marital Status: Married    Spouse Name: N/A  . Number of Children: N/A  . Years of Education: N/A   Occupational History  . 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 Smoking status: Never Smoker   . Smokeless tobacco: Never Used  . Alcohol Use: No     Comment: occasionally  . Drug Use: No  . Sexual Activity:    Partners: Male    Birth Control/ Protection: Surgical   Other Topics Concern  . Not on file   Social History Narrative     Objective: BP 137/88 mmHg  Pulse 90  Wt 147 lb (66.679 kg)  Vital signs reviewed. General: Alert and Oriented, No Acute Distress HEENT: Pupils equal, round, reactive to light. Conjunctivae clear.  External ears unremarkable.  Moist  mucous membranes. Lungs: Clear and comfortable work of breathing, speaking in full sentences without accessory muscle use. Cardiac: Regular rate and rhythm.  Neuro: CN II-XII grossly intact, gait normal. Extremities: No peripheral edema.  Strong peripheral pulses.  Mental Status: No depression, anxiety, nor agitation. Logical though process. Skin: Warm and dry.  Assessment & Plan: [name] was seen today for food stuck in throat.  Diagnoses and all orders for this visit:  Esophageal stricture -     pantoprazole (PROTONIX) 40 MG tablet; Take 1 tablet (40 mg total) by mouth daily.   Sounds like her esophageal stricture is returning. I've encouraged her to start on Protonix and if not improved by July 4 call me and I'll refer to gastroenterology for consideration of endoscopy. If Protonix is beneficial plan on taking this for at least 2-3 months.  Return if symptoms worsen or fail to improve.

## 2016-01-13 ENCOUNTER — Telehealth: Payer: Self-pay

## 2016-01-13 DIAGNOSIS — K219 Gastro-esophageal reflux disease without esophagitis: Secondary | ICD-10-CM

## 2016-01-13 NOTE — Telephone Encounter (Signed)
Pt reports that the pantoprazole that was Rx isn't working and is asking for a referral to an GI specialist.

## 2016-01-13 NOTE — Telephone Encounter (Signed)
Referral has been placed now

## 2016-01-16 NOTE — Telephone Encounter (Signed)
Pt.notified

## 2016-01-18 ENCOUNTER — Encounter: Payer: Self-pay | Admitting: Gastroenterology

## 2016-01-19 ENCOUNTER — Other Ambulatory Visit: Payer: Self-pay | Admitting: Family Medicine

## 2016-01-23 ENCOUNTER — Other Ambulatory Visit: Payer: Self-pay | Admitting: Family Medicine

## 2016-01-23 ENCOUNTER — Encounter: Payer: Self-pay | Admitting: Family Medicine

## 2016-01-23 DIAGNOSIS — Z1231 Encounter for screening mammogram for malignant neoplasm of breast: Secondary | ICD-10-CM

## 2016-01-31 ENCOUNTER — Encounter: Payer: Self-pay | Admitting: Family Medicine

## 2016-01-31 ENCOUNTER — Ambulatory Visit (INDEPENDENT_AMBULATORY_CARE_PROVIDER_SITE_OTHER): Payer: BC Managed Care – PPO | Admitting: Family Medicine

## 2016-01-31 VITALS — BP 145/90 | HR 89 | Wt 145.0 lb

## 2016-01-31 DIAGNOSIS — L811 Chloasma: Secondary | ICD-10-CM | POA: Diagnosis not present

## 2016-01-31 LAB — GLUCOSE, POCT (MANUAL RESULT ENTRY): POC GLUCOSE: 92 mg/dL (ref 70–99)

## 2016-01-31 MED ORDER — TRETINOIN 0.05 % EX CREA: TOPICAL_CREAM | Freq: Every day | CUTANEOUS | Status: DC

## 2016-01-31 MED ORDER — METOPROLOL SUCCINATE ER 25 MG PO TB24
ORAL_TABLET | ORAL | Status: DC
Start: 2016-01-31 — End: 2016-01-31

## 2016-01-31 MED ORDER — METOPROLOL SUCCINATE ER 25 MG PO TB24: ORAL_TABLET | ORAL | Status: DC

## 2016-01-31 NOTE — Progress Notes (Signed)
CC: [name] is a 50 y.o. female is here for dark circles around eyes and Hypertension   Subjective: HPI:  This morning she was told by her dentist that she had dark circles around her eyes and that this could represent something serious. She tells me she's had this for a long time probably matter of months and always discovered up with makeup. It doesn't seem to be getting larger or smaller. It doesn't cause any pain and it does not itch. She denies having this anywhere else on her body. She can't recall when exactly started. She denies any other skin lesions. She denies fevers, chills or ocular pain   Review Of Systems Outlined In HPI  Past Medical History  Diagnosis Date  . Irregular menstrual bleeding 02/27/2011  . Esophageal stricture 12/16/2012    Dilated Spring 2014   . Tachycardia     Past Surgical History  Procedure Laterality Date  . Cesarean section      x 2 last in 99  . Tubal ligation  2000  . Nasal polyp surgery     Family History  Problem Relation Age of Onset  . Diabetes Mother   . Hypertension Mother     Social History   Social History  . Marital Status: Married    Spouse Name: N/A  . Number of Children: N/A  . Years of Education: N/A   Occupational History  . 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 Smoking status: Never Smoker   . Smokeless tobacco: Never Used  . Alcohol Use: No     Comment: occasionally  . Drug Use: No  . Sexual Activity:    Partners: Male    Birth Control/ Protection: Surgical   Other Topics Concern  . Not on file   Social History Narrative     Objective: BP 145/90 mmHg  Pulse 89  Wt 145 lb (65.772 kg)  Vital signs reviewed. General: Alert and Oriented, No Acute Distress HEENT: Pupils equal, round, reactive to light. Conjunctivae clear.  External ears unremarkable.  Moist mucous membranes. Lungs: Clear and comfortable work of breathing, speaking in full sentences without accessory  muscle use. Cardiac: Regular rate and rhythm.  Neuro: CN II-XII grossly intact, gait normal. Extremities: No peripheral edema.  Strong peripheral pulses.  Mental Status: No depression, anxiety, nor agitation. Logical though process. Skin: Warm and dry. Faint hyperpigmentation involving the lower eyelids and 1.5 inches laterally. Involves both ocular regions Assessment & Plan: [name] was seen today for dark circles around eyes and hypertension.  Diagnoses and all orders for this visit:  Melasma -     tretinoin (RETIN-A) 0.05 % cream; Apply topically at bedtime. Will take up to a month to see improvement. -     POCT Glucose (CBG)  Other orders -     metoprolol succinate (TOPROL-XL) 25 MG 24 hr tablet; TAKE 1 TABLET (25 MG TOTAL) BY MOUTH DAILY.   Discussed benign nature melasma with her blood sugar being normal today. Start Retin-A and come back in 6 weeks if no improvement. Discussed avoiding ultraviolet light to help reduce the appearance of this, she just got back from a cruise to the [place]. I've asked her to continue a dose of daily metoprolol and check blood pressures daily basis and drop off in one week.  Return if symptoms worsen or fail to improve.

## 2016-01-31 NOTE — Addendum Note (Signed)
Addended by: Marcial Pacas on: 01/31/2016 04:51 PM   Modules accepted: Orders

## 2016-02-10 ENCOUNTER — Ambulatory Visit (INDEPENDENT_AMBULATORY_CARE_PROVIDER_SITE_OTHER): Payer: BC Managed Care – PPO

## 2016-02-10 DIAGNOSIS — Z1231 Encounter for screening mammogram for malignant neoplasm of breast: Secondary | ICD-10-CM | POA: Diagnosis not present

## 2016-02-28 ENCOUNTER — Ambulatory Visit: Payer: BC Managed Care – PPO | Admitting: Obstetrics & Gynecology

## 2016-03-01 ENCOUNTER — Encounter: Payer: Self-pay | Admitting: Obstetrics & Gynecology

## 2016-03-01 ENCOUNTER — Ambulatory Visit (INDEPENDENT_AMBULATORY_CARE_PROVIDER_SITE_OTHER): Payer: BC Managed Care – PPO | Admitting: Obstetrics & Gynecology

## 2016-03-01 VITALS — BP 135/83 | HR 90 | Ht 68.0 in | Wt 147.0 lb

## 2016-03-01 DIAGNOSIS — Z124 Encounter for screening for malignant neoplasm of cervix: Secondary | ICD-10-CM | POA: Diagnosis not present

## 2016-03-01 DIAGNOSIS — Z01419 Encounter for gynecological examination (general) (routine) without abnormal findings: Secondary | ICD-10-CM

## 2016-03-01 DIAGNOSIS — Z1151 Encounter for screening for human papillomavirus (HPV): Secondary | ICD-10-CM | POA: Diagnosis not present

## 2016-03-01 MED ORDER — ALPRAZOLAM 0.5 MG PO TABS: 0.5000 mg | ORAL_TABLET | Freq: Every evening | ORAL | 0 refills | Status: DC | PRN

## 2016-03-01 NOTE — Progress Notes (Signed)
Subjective:    [name] is a 50 y.o. P2 female who presents for an annual exam. The patient has no complaints today. Her husband died of colon cancer 08/16/22. She is seeing a Social worker but is having some anxiety.  The patient is not currently sexually active. GYN screening history: last pap: was normal. The patient wears seatbelts: yes. The patient participates in regular exercise: yes. Has the patient ever been transfused or tattooed?: no. The patient reports that there is not domestic violence in her life.   Menstrual History: OB History    Gravida Para Term Preterm AB Living   2 2       2    SAB TAB Ectopic Multiple Live Births                  Menarche age: 6 Patient's last menstrual period was 01/14/2016 (approximate).    The following portions of the patient's history were reviewed and updated as appropriate: allergies, current medications, past family history, past medical history, past social history, past surgical history and problem list.  Review of Systems Pertinent items are noted in HPI. Mammogram normal 3 weeks ago. Periods are getting really irregular   Objective:    BP 135/83   Pulse 90   Ht 5\' 8"  (1.727 m)   Wt 147 lb (66.7 kg)   LMP 01/14/2016 (Approximate) Comment: Pt states that her periods used to come at the same time but now they are irregular and the blood is darker and comes out in clots  BMI 22.35 kg/m   General Appearance:    Alert, cooperative, no distress, appears stated age  Head:    Normocephalic, without obvious abnormality, atraumatic  Eyes:    PERRL, conjunctiva/corneas clear, EOM's intact, fundi    benign, both eyes  Ears:    Normal TM's and external ear canals, both ears  Nose:   Nares normal, septum midline, mucosa normal, no drainage    or sinus tenderness  Throat:   Lips, mucosa, and tongue normal; teeth and gums normal  Neck:   Supple, symmetrical, trachea midline, no adenopathy;    thyroid:  no enlargement/tenderness/nodules; no  carotid   bruit or JVD  Back:     Symmetric, no curvature, ROM normal, no CVA tenderness  Lungs:     Clear to auscultation bilaterally, respirations unlabored  Chest Wall:    No tenderness or deformity   Heart:    Regular rate and rhythm, S1 and S2 normal, no murmur, rub   or gallop  Breast Exam:    No tenderness, masses, or nipple abnormality  Abdomen:     Soft, non-tender, bowel sounds active all four quadrants,    no masses, no organomegaly  Genitalia:    Normal female without lesion, discharge or tenderness, NSSA, NT, mobile, no palpable adnexal masses     Extremities:   Extremities normal, atraumatic, no cyanosis or edema  Pulses:   2+ and symmetric all extremities  Skin:   Skin color, texture, turgor normal, no rashes or lesions  Lymph nodes:   Cervical, supraclavicular, and axillary nodes normal  Neurologic:   CNII-XII intact, normal strength, sensation and reflexes    throughout   .    Assessment:    Healthy female exam.    Plan:     Thin prep Pap smear. with cotesting (h/o + HR HPV) Xanax prn #30 NO REFILLS

## 2016-03-05 LAB — CYTOLOGY - PAP

## 2016-03-22 ENCOUNTER — Ambulatory Visit: Payer: BC Managed Care – PPO | Admitting: Gastroenterology

## 2016-03-28 ENCOUNTER — Other Ambulatory Visit: Payer: Self-pay | Admitting: Physician Assistant

## 2016-03-28 ENCOUNTER — Ambulatory Visit (INDEPENDENT_AMBULATORY_CARE_PROVIDER_SITE_OTHER): Payer: BC Managed Care – PPO | Admitting: Physician Assistant

## 2016-03-28 ENCOUNTER — Encounter: Payer: Self-pay | Admitting: Physician Assistant

## 2016-03-28 VITALS — BP 130/78 | HR 85 | Ht 68.0 in | Wt 148.0 lb

## 2016-03-28 DIAGNOSIS — Z23 Encounter for immunization: Secondary | ICD-10-CM

## 2016-03-28 DIAGNOSIS — J45901 Unspecified asthma with (acute) exacerbation: Secondary | ICD-10-CM

## 2016-03-28 DIAGNOSIS — Z13220 Encounter for screening for lipoid disorders: Secondary | ICD-10-CM

## 2016-03-28 DIAGNOSIS — Z131 Encounter for screening for diabetes mellitus: Secondary | ICD-10-CM | POA: Diagnosis not present

## 2016-03-28 DIAGNOSIS — Z0000 Encounter for general adult medical examination without abnormal findings: Secondary | ICD-10-CM | POA: Diagnosis not present

## 2016-03-28 DIAGNOSIS — H6123 Impacted cerumen, bilateral: Secondary | ICD-10-CM | POA: Diagnosis not present

## 2016-03-28 DIAGNOSIS — H6120 Impacted cerumen, unspecified ear: Secondary | ICD-10-CM | POA: Insufficient documentation

## 2016-03-28 MED ORDER — METHYLPREDNISOLONE SODIUM SUCC 125 MG IJ SOLR
125.0000 mg | Freq: Once | INTRAMUSCULAR | Status: AC
  Administered 2016-03-28: 125 mg via INTRAMUSCULAR

## 2016-03-28 NOTE — Patient Instructions (Signed)
Keeping You Healthy  Get These Tests  Blood Pressure- Have your blood pressure checked by your healthcare provider at least once a year.  Normal blood pressure is 120/80.  Weight- Have your body mass index (BMI) calculated to screen for obesity.  BMI is a measure of body fat based on height and weight.  You can calculate your own BMI at [email or web address].com/bmi/  Cholesterol- Have your cholesterol checked every year.  Diabetes- Have your blood sugar checked every year if you have high blood pressure, high cholesterol, a family history of diabetes or if you are overweight.  Pap Test - Have a pap test every 1 to 5 years if you have been sexually active.  If you are older than 51 and recent pap tests have been normal you may not need additional pap tests.  In addition, if you have had a hysterectomy  for benign disease additional pap tests are not necessary.  Mammogram-Yearly mammograms are essential for early detection of breast cancer  Screening for Colon Cancer- Colonoscopy starting at age 50. Screening may begin sooner depending on your family history and other health conditions.  Follow up colonoscopy as directed by your Gastroenterologist.  Screening for Osteoporosis- Screening begins at age 51 with bone density scanning, sooner if you are at higher risk for developing Osteoporosis.  Get these medicines  Calcium with Vitamin D- Your body requires 1200-1500 mg of Calcium a day and 800-1000 IU of Vitamin D a day.  You can only absorb 500 mg of Calcium at a time therefore Calcium must be taken in 2 or 3 separate doses throughout the day.  Hormones- Hormone therapy has been associated with increased risk for certain cancers and heart disease.  Talk to your healthcare provider about if you need relief from menopausal symptoms.  Aspirin- Ask your healthcare provider about taking Aspirin to prevent Heart Disease and Stroke.  Get these Immuniztions  Flu shot- Every fall  Pneumonia shot-  Once after the age of 65; if you are younger ask your healthcare provider if you need a pneumonia shot.  Tetanus- Every ten years.  Zostavax- Once after the age of 51 to prevent shingles.  Take these steps  Don't smoke- Your healthcare provider can help you quit. For tips on how to quit, ask your healthcare provider or go to www.smokefree.gov or call 1-800 QUIT-NOW.  Be physically active- Exercise 5 days a week for a minimum of 30 minutes.  If you are not already physically active, start slow and gradually work up to 30 minutes of moderate physical activity.  Try walking, dancing, bike riding, swimming, etc.  Eat a healthy diet- Eat a variety of healthy foods such as fruits, vegetables, whole grains, low fat milk, low fat cheeses, yogurt, lean meats, chicken, fish, eggs, dried beans, tofu, etc.  For more information go to www.thenutritionsource.org  Dental visit- Brush and floss teeth twice daily; visit your dentist twice a year.  Eye exam- Visit your Optometrist or Ophthalmologist yearly.  Drink alcohol in moderation- Limit alcohol intake to one drink or less a day.  Never drink and drive.  Depression- Your emotional health is as important as your physical health.  If you're feeling down or losing interest in things you normally enjoy, please talk to your healthcare provider.  Seat Belts- can save your life; always wear one  Smoke/Carbon Monoxide detectors- These detectors need to be installed on the appropriate level of your home.  Replace batteries at least once a year.  Violence- If   anyone is threatening or hurting you, please tell your healthcare provider.  Living Will/ Health care power of attorney- Discuss with your healthcare provider and family. 

## 2016-03-28 NOTE — Progress Notes (Addendum)
Subjective:     Patient ID: [name], female   DOB: 1966/05/01, 50 y.o.   MRN: [id number]  HPI Patient is a 50 y.o. African-american female with asthma and PSVT presenting today for her annual physical exam. The patient notes that she is currently recovering from a recent sinus infection and cough. Patient states that when she has a cough she usually has an acute exacerbation of her asthma symptoms and receives a steroid injection. The patient notes that she was recently prescribed clindamycin and ibuprofen 800mg  every 6 hours for the last 8 days. Patient states that she is feeling better but is still having congestion with her cough. The patient notes that she is currently following with an asthma specialist who is managing her medications including the dulera that she is taking 2 puffs of twice daily, Spiriva, and combivent. The patient notes that she is also currently going through menopause and is experiencing symptoms. The patient denies shortness of breath, chest pain, wheezing, or fever.  Review of Systems  Constitutional: Negative for activity change, appetite change, chills, diaphoresis, fatigue, fever and unexpected weight change.  HENT: Positive for congestion, ear pain, postnasal drip and sinus pressure. Negative for ear discharge, rhinorrhea, sneezing and sore throat.   Eyes: Negative for photophobia, pain, discharge, redness, itching and visual disturbance.  Respiratory: Positive for cough. Negative for choking, chest tightness, shortness of breath and wheezing.   Cardiovascular: Negative for chest pain, palpitations and leg swelling.  Gastrointestinal: Negative.   Genitourinary: Negative.   Musculoskeletal: Negative.   Neurological: Positive for headaches. Negative for dizziness, weakness, light-headedness and numbness.  Psychiatric/Behavioral: Negative.       Objective:   Physical Exam  Constitutional: She is oriented to person, place, and time. She appears well-developed  and well-nourished. No distress.  HENT:  Head: Normocephalic and atraumatic.  Nose: Nose normal.  Bilateral cerumen impactions noted on inspection.  Eyes: Conjunctivae and EOM are normal. Pupils are equal, round, and reactive to light. Right eye exhibits no discharge. Left eye exhibits no discharge. No scleral icterus.  Neck: Normal range of motion. Neck supple. No JVD present. No tracheal deviation present. No thyromegaly present.  Cardiovascular: Normal rate, regular rhythm, normal heart sounds and intact distal pulses.  Exam reveals no gallop and no friction rub.   No murmur heard. Pulmonary/Chest: No stridor. She is in respiratory distress. She has wheezes. She has no rales. She exhibits no tenderness.  Coarse rhonchi auscultated bilaterally.  Abdominal: Soft. Bowel sounds are normal. She exhibits no distension and no mass. There is no tenderness. There is no rebound and no guarding.  Musculoskeletal: Normal range of motion. She exhibits no edema, tenderness or deformity.  Lymphadenopathy:    She has no cervical adenopathy.  Neurological: She is alert and oriented to person, place, and time. She has normal reflexes. She displays normal reflexes. No cranial nerve deficit. She exhibits normal muscle tone. Coordination normal.  Skin: Skin is warm and dry. No rash noted. She is not diaphoretic. No erythema. No pallor.  Psychiatric: She has a normal mood and affect. Her behavior is normal. Judgment and thought content normal.  Nursing note and vitals reviewed.     Assessment:     [name] was seen today for annual exam.  Diagnoses and all orders for this visit:  Routine physical examination -     Lipid panel -     COMPLETE METABOLIC PANEL WITH GFR -     CBC  Influenza vaccine needed -  Flu Vaccine QUAD 36+ mos PF IM (Fluarix & Fluzone Quad PF)  Cerumen impaction, bilateral  Screening for diabetes mellitus -     COMPLETE METABOLIC PANEL WITH GFR  Screening for lipid  disorders -     Lipid panel  Asthma with acute exacerbation, unspecified asthma severity -     methylPREDNISolone sodium succinate (SOLU-MEDROL) 125 mg/2 mL injection 125 mg; Inject 2 mLs (125 mg total) into the muscle once.      Plan:     1. Physical: Patient comes in today for annual physical examination. I performed thorough physical exam including electronic laboratory requisitoin for routine labwork (CBC, CMP, lipid panel). Patient encouraged to engage in 150 minutes of exercise a week and take oral over-the-counter Vitamin D 800 units and Calcium 1500mg  daily and/or 4 servings of dairy a day. Pt given influenza vaccination. Patient to will be contacted via phone for lab review.  2. Cerumen impaction - bilateral cerumen impaction is noted.  Wax is removed by syringing and manual debridement. Instructions for home care including over-the-counter Debrox to prevent wax buildup are given.  3. Asthma with acute exacerbation - Patient currently taking Dulera 2 puffs twice daily, Spiriva daily, and combivent rescue inhaled. Patient to continue chronic care with pulmonologist. Patient was given solu-medrol injectoin 125mg  today in clinic. Patient instructed to complete course of clindamycin and return-to-clinic if symptoms do not improve or worse.    Summary- Patient was given flu-shot in clinic today. Patient to return if symptoms persist or worsen.

## 2016-03-29 LAB — COMPLETE METABOLIC PANEL WITH GFR
ALK PHOS: 33 U/L (ref 33–130)
ALT: 13 U/L (ref 6–29)
AST: 15 U/L (ref 10–35)
Albumin: 3.8 g/dL (ref 3.6–5.1)
BUN: 9 mg/dL (ref 7–25)
CO2: 26 mmol/L (ref 20–31)
Calcium: 9.5 mg/dL (ref 8.6–10.4)
Chloride: 105 mmol/L (ref 98–110)
Creat: 0.71 mg/dL (ref 0.50–1.05)
GFR, Est African American: >89 mL/min (ref >=60)
GLUCOSE: 128 mg/dL — AB (ref 65–99)
POTASSIUM: 4 mmol/L (ref 3.5–5.3)
SODIUM: 140 mmol/L (ref 135–146)
Total Bilirubin: 0.3 mg/dL (ref 0.2–1.2)
Total Protein: 6.3 g/dL (ref 6.1–8.1)

## 2016-03-29 LAB — CBC
HCT: 38.8 % (ref 35.0–45.0)
HEMOGLOBIN: 12.7 g/dL (ref 11.7–15.5)
MCH: 29.4 pg (ref 27.0–33.0)
MCHC: 32.7 g/dL (ref 32.0–36.0)
MCV: 89.8 fL (ref 80.0–100.0)
MPV: 11.2 fL (ref 7.5–12.5)
Platelets: 269 10*3/uL (ref 140–400)
RBC: 4.32 MIL/uL (ref 3.80–5.10)
RDW: 13.3 % (ref 11.0–15.0)
WBC: 6.8 10*3/uL (ref 3.8–10.8)

## 2016-03-29 LAB — LIPID PANEL
CHOL/HDL RATIO: 2.4 ratio (ref <=5.0)
Cholesterol: 222 mg/dL — ABNORMAL HIGH (ref 125–200)
HDL: 93 mg/dL (ref >=46)
LDL CALC: 124 mg/dL (ref <130)
Triglycerides: 26 mg/dL (ref <150)
VLDL: 5 mg/dL (ref <30)

## 2016-03-30 DIAGNOSIS — J45901 Unspecified asthma with (acute) exacerbation: Secondary | ICD-10-CM | POA: Insufficient documentation

## 2016-04-03 LAB — HEMOGLOBIN A1C
HEMOGLOBIN A1C: 5.6 % (ref <5.7)
Mean Plasma Glucose: 114 mg/dL

## 2016-06-04 ENCOUNTER — Ambulatory Visit (INDEPENDENT_AMBULATORY_CARE_PROVIDER_SITE_OTHER): Payer: BC Managed Care – PPO | Admitting: Family Medicine

## 2016-06-04 ENCOUNTER — Encounter: Payer: Self-pay | Admitting: Family Medicine

## 2016-06-04 VITALS — BP 143/88 | HR 82 | Wt 145.0 lb

## 2016-06-04 DIAGNOSIS — N6320 Unspecified lump in the left breast, unspecified quadrant: Secondary | ICD-10-CM

## 2016-06-04 NOTE — Patient Instructions (Signed)
Thank you for coming in today. You should hear from the breast center soon.  Let me know if you do not hear anything by Wednesday.     Breast Cyst A breast cyst is a sac in the breast that is filled with fluid. Breast cysts are usually noncancerous (benign). They are common among women, and they are most often located in the upper, outer portion of the breast. One or more cysts may develop. They form when fluid builds up inside of the breast glands. There are several types of breast cysts:  Macrocyst. This is a cyst that is about 2 inches (5.1 cm) across (in diameter).  Microcyst. This is a very small cyst that you cannot feel, but it can be seen with imaging tests such as an X-ray of the breast (mammogram) or ultrasound.  Galactocele. This is a cyst that contains milk. It may develop if you suddenly stop breastfeeding. Breast cysts do not increase your risk of breast cancer. They usually disappear after menopause, unless you take artificial hormones (are on hormone therapy). What are the causes? The exact cause of breast cysts is not known. Possible causes include:  Blockage of tubes (ducts) in the breast glands, which leads to fluid buildup. Duct blockage may result from:  Fibrocystic breast changes. This is a common, benign condition that occurs when women go through hormonal changes during the menstrual cycle. This is a common cause of multiple breast cysts.  Overgrowth of breast tissue or breast glands.  Scar tissue in the breast from previous surgery.  Changes in certain female hormones (estrogen and progesterone). What increases the risk? You may be more likely to develop breast cysts if you have not gone through menopause. What are the signs or symptoms? Symptoms of a breast cyst may include:  Feeling one or more smooth, round, soft lumps (like grapes) in the breast that are easily moveable. The lump(s) may get bigger and more painful before your period and get smaller after  your period.  Breast discomfort or pain. How is this diagnosed? A cyst can be felt during a physical exam by your health care provider. A mammogram and ultrasound will be done to confirm the diagnosis. Fluid may be removed from the cyst with a needle (fine-needle aspiration) and tested to make sure the cyst is not cancerous. How is this treated? Treatment may not be necessary. Your health care provider may monitor the cyst to see if it goes away on its own. If the cyst is uncomfortable or gets bigger, or if you do not like how the cyst makes your breast look, you may need treatment. Treatment may include:  Hormone treatment.  Fine-needle aspiration, to drain fluid from the cyst. There is a chance of the cyst coming back (recurring) after aspiration.  Surgery to remove the cyst. Follow these instructions at home:  See your health care provider regularly.  Get a yearly physical exam.  If you are 7-26 years old, get a clinical breast exam every 1-3 years. After age 80, get this exam every year.  Get mammograms as often as directed.  Do a breast self-exam every month, or as often as directed. Having many breast cysts, or "lumpy" breasts, may make it harder to feel for new lumps. Understand how your breasts normally look and feel, and write down any changes in your breasts so you can tell your health care provider about the changes. A breast self-exam involves:  Comparing your breasts in the mirror.  Looking for  visible changes in your skin or nipples.  Feeling for lumps or changes.  Take over-the-counter and prescription medicines only as told by your health care provider.  Wear a supportive bra, especially when exercising.  Follow instructions from your health care provider about eating and drinking restrictions.  Avoid caffeine.  Cut down on salt (sodium) in what you eat and drink, especially before your menstrual period. Too much sodium can cause fluid buildup (retention),  breast swelling, and discomfort.  Keep all follow-up visits as told your health care provider. This is important. Contact a health care provider if:  You feel, or think you feel, a lump in your breast.  You notice that both breasts look or feel different than usual.  Your breast is still causing pain after your menstrual period is over.  You find new lumps or bumps that were not there before.  You feel lumps in your armpit (axilla). Get help right away if:  You have severe pain, tenderness, redness, or warmth in your breast.  You have fluid or blood leaking from your nipple.  Your breast lump becomes hard and painful.  You notice dimpling or wrinkling of the breast or nipple. This information is not intended to replace advice given to you by your health care provider. Make sure you discuss any questions you have with your health care provider. Document Released: 07/02/2005 Document Revised: 03/23/2016 Document Reviewed: 03/23/2016 Elsevier Interactive Patient Education  2017 [employer].

## 2016-06-04 NOTE — Progress Notes (Signed)
[name] is a 50 y.o. female who presents to [facility] today for left breast mass.  She noticed a 0.5 cm, painless mass on the inside of her left breast while changing 3 days ago. She has not noticed any nipple discharge, tenderness, fevers, or recent breast trauma. She has had a small cyst in her right breast that was aspirated over 20 years ago.   OB/GYN history: G2P2. Menarche was at age 89. Currently on her period. Her periods have been lighter and irregular for the past few months; no hot flashes. She had a normal mammogram 4 months ago. She has never had hormone replacement. Took birth control a long time ago but not recently.  Family history is negative for breast cancer and breast masses.   Past Medical History:  Diagnosis Date  . Asthma   . Esophageal stricture 12/16/2012   Dilated Spring 2014   . Irregular menstrual bleeding 02/27/2011  . Tachycardia    Past Surgical History:  Procedure Laterality Date  . CESAREAN SECTION     x 2 last in 99  . NASAL POLYP SURGERY    . TUBAL LIGATION  2000   Social History  Substance Use Topics  . Smoking status: Never Smoker  . Smokeless tobacco: Never Used  . Alcohol use Yes     Comment: occasionally   family history includes Diabetes in her mother; Hypertension in her mother.  ROS as above:  Medications: Current Outpatient Prescriptions  Medication Sig Dispense Refill  . albuterol-ipratropium (COMBIVENT) 18-103 MCG/ACT inhaler Inhale 1 puff into the lungs every 4 (four) hours.    . budesonide (PULMICORT) 0.5 MG/2ML nebulizer solution     . BUDESONIDE NA Place into the nose.    . fluticasone (FLONASE) 50 MCG/ACT nasal spray 2 SPRAYS INTO EACH NOSTRIL ONCE DAILY OR 1 SPRAY INTO EACH NOSTRIL TWICE DAILY.    . metoprolol succinate (TOPROL-XL) 25 MG 24 hr tablet TAKE 1 TABLET (25 MG TOTAL) BY MOUTH DAILY.  90 tablet 0  . mometasone-formoterol (DULERA) 200-5 MCG/ACT AERO Inhale into the lungs.    . pantoprazole (PROTONIX) 40 MG tablet Take 1 tablet (40 mg total) by mouth daily. 30 tablet 2  . tiotropium (SPIRIVA) 18 MCG inhalation capsule Place 18 mcg into inhaler and inhale daily.     No current facility-administered medications for this visit.    Allergies  Allergen Reactions  . Amoxicillin     Health Maintenance Health Maintenance  Topic Date Due  . MAMMOGRAM  02/09/2018  . PAP SMEAR  03/02/2019  . TETANUS/TDAP  02/10/2024  . COLONOSCOPY  11/13/2024  . INFLUENZA VACCINE  Completed  . HIV Screening  Completed     Exam:  BP (!) 143/88   Pulse 82   Wt 145 lb (65.8 kg)   BMI 22.05 kg/m  General: Well Developed, well nourished, and in no acute distress.  Neuro/Psych: Alert and oriented x3, extra-ocular muscles intact, able to move all 4 extremities, sensation grossly intact. Skin: Warm and dry, no rashes noted.  Respiratory: Not using accessory muscles, speaking in full sentences, trachea midline.  Cardiovascular: Pulses palpable, no extremity edema. Abdomen: Does not appear distended.  Breast: L breast with pea-sized, well-circumscribed, firm, mobile mass in the lower inner quadrant. No other masses in left or right breasts. No nipple retraction or discharge. No axillary lymphadenopathy.  No results found for this or any previous visit (from the past 72  hour(s)). No results found.   Assessment and Plan: 50 y.o. female with pea-sized, firm, mobile mass in the left breast. She has no significant risk factors for breast cancer (lifetime risk of breast cancer 8.5% per the [facility] model, below average for her age). Most likely benign etiology such as fibroadenoma or cyst, but given age, will get Diagnostic mammogram and ultrasound. We will see her back if imaging is negative; refer to specialist if positive.   Orders Placed This Encounter  Procedures  . MM Digital Diagnostic  Unilat L    Standing Status:   Future    Standing Expiration Date:   08/04/2017    Order Specific Question:   Reason for Exam (SYMPTOM  OR DIAGNOSIS REQUIRED)    Answer:   eval breast mass    Order Specific Question:   Is the patient pregnant?    Answer:   No    Order Specific Question:   Preferred imaging location?    Answer:   [facility]  . US BREAST COMPLETE UNI LEFT INC AXILLA    Standing Status:   Future    Standing Expiration Date:   08/04/2017    Order Specific Question:   Reason for Exam (SYMPTOM  OR DIAGNOSIS REQUIRED)    Answer:   eval breast mass    Order Specific Question:   Preferred imaging location?    Answer:   [facility]    Discussed warning signs or symptoms. Please see discharge instructions. Patient expresses understanding.

## 2016-07-06 ENCOUNTER — Inpatient Hospital Stay: Admission: RE | Admit: 2016-07-06 | Payer: BC Managed Care – PPO | Source: Ambulatory Visit

## 2016-07-18 ENCOUNTER — Other Ambulatory Visit: Payer: Self-pay | Admitting: Physician Assistant

## 2016-07-19 ENCOUNTER — Other Ambulatory Visit: Payer: Self-pay | Admitting: *Deleted

## 2016-07-19 MED ORDER — METOPROLOL SUCCINATE ER 25 MG PO TB24: ORAL_TABLET | ORAL | 0 refills | Status: DC

## 2016-08-15 ENCOUNTER — Ambulatory Visit (INDEPENDENT_AMBULATORY_CARE_PROVIDER_SITE_OTHER): Payer: BC Managed Care – PPO

## 2016-08-15 ENCOUNTER — Ambulatory Visit (INDEPENDENT_AMBULATORY_CARE_PROVIDER_SITE_OTHER): Payer: BC Managed Care – PPO | Admitting: Family Medicine

## 2016-08-15 ENCOUNTER — Encounter: Payer: Self-pay | Admitting: Family Medicine

## 2016-08-15 VITALS — BP 116/76 | HR 113 | Temp 102.9°F | Wt 144.0 lb

## 2016-08-15 DIAGNOSIS — J4551 Severe persistent asthma with (acute) exacerbation: Secondary | ICD-10-CM

## 2016-08-15 DIAGNOSIS — J111 Influenza due to unidentified influenza virus with other respiratory manifestations: Secondary | ICD-10-CM | POA: Diagnosis not present

## 2016-08-15 DIAGNOSIS — J45909 Unspecified asthma, uncomplicated: Secondary | ICD-10-CM

## 2016-08-15 MED ORDER — PREDNISONE 10 MG PO TABS: 30.0000 mg | ORAL_TABLET | Freq: Every day | ORAL | 0 refills | Status: DC

## 2016-08-15 MED ORDER — IPRATROPIUM BROMIDE 0.06 % NA SOLN: 2.0000 | NASAL | 6 refills | Status: DC | PRN

## 2016-08-15 MED ORDER — OSELTAMIVIR PHOSPHATE 75 MG PO CAPS: 75.0000 mg | ORAL_CAPSULE | Freq: Two times a day (BID) | ORAL | 0 refills | Status: DC

## 2016-08-15 MED ORDER — GUAIFENESIN-CODEINE 100-10 MG/5ML PO SOLN: 5.0000 mL | Freq: Every evening | ORAL | 0 refills | Status: DC | PRN

## 2016-08-15 NOTE — Progress Notes (Signed)
[name] is a 51 y.o. female who presents to [facility] today for fevers chills body aches headaches. Symptoms started yesterday and worsened today. Patient has a history of asthma and has been using her inhalers regularly. She denies significant wheezing or chest tightness. She denies chest pain or palpitations. She's not tried much medications yet to control fever. She feels poorly today.   Past Medical History:  Diagnosis Date  . Asthma   . Esophageal stricture 12/16/2012   Dilated Spring 2014   . Irregular menstrual bleeding 02/27/2011  . Tachycardia    Past Surgical History:  Procedure Laterality Date  . CESAREAN SECTION     x 2 last in 99  . NASAL POLYP SURGERY    . TUBAL LIGATION  2000   Social History  Substance Use Topics  . Smoking status: Never Smoker  . Smokeless tobacco: Never Used  . Alcohol use Yes     Comment: occasionally   family history includes Diabetes in her mother; Hypertension in her mother.  ROS as above:  Medications: Current Outpatient Prescriptions  Medication Sig Dispense Refill  . albuterol-ipratropium (COMBIVENT) 18-103 MCG/ACT inhaler Inhale 1 puff into the lungs every 4 (four) hours.    . budesonide (PULMICORT) 0.5 MG/2ML nebulizer solution     . BUDESONIDE NA Place into the nose.    . fluticasone (FLONASE) 50 MCG/ACT nasal spray 2 SPRAYS INTO EACH NOSTRIL ONCE DAILY OR 1 SPRAY INTO EACH NOSTRIL TWICE DAILY.    . metoprolol succinate (TOPROL-XL) 25 MG 24 hr tablet TAKE 1 TABLET (25 MG TOTAL) BY MOUTH DAILY. PLEASE MAKE APPOINTMENT WITH NEW PCP. 90 tablet 0  . mometasone-formoterol (DULERA) 200-5 MCG/ACT AERO Inhale into the lungs.    . pantoprazole (PROTONIX) 40 MG tablet Take 1 tablet (40 mg total) by mouth daily. 30 tablet 2  . tiotropium (SPIRIVA) 18 MCG inhalation capsule Place 18 mcg into inhaler and inhale  daily.    Marland Kitchen guaiFENesin-codeine 100-10 MG/5ML syrup Take 5 mLs by mouth at bedtime as needed for cough. 120 mL 0  . ipratropium (ATROVENT) 0.06 % nasal spray Place 2 sprays into both nostrils every 4 (four) hours as needed for rhinitis. 10 mL 6  . oseltamivir (TAMIFLU) 75 MG capsule Take 1 capsule (75 mg total) by mouth 2 (two) times daily. 10 capsule 0  . predniSONE (DELTASONE) 10 MG tablet Take 3 tablets (30 mg total) by mouth daily with breakfast. 15 tablet 0   No current facility-administered medications for this visit.    Allergies  Allergen Reactions  . Amoxicillin     Health Maintenance Health Maintenance  Topic Date Due  . MAMMOGRAM  02/09/2018  . PAP SMEAR  03/02/2019  . TETANUS/TDAP  02/10/2024  . COLONOSCOPY  11/13/2024  . INFLUENZA VACCINE  Completed  . HIV Screening  Completed     Exam:  BP 116/76   Pulse (!) 113   Temp (!) 102.9 F (39.4 C) (Oral)   Wt 144 lb (65.3 kg)   LMP 08/11/2016   SpO2 100%   BMI 21.90 kg/m  Gen: Sick but nontoxic-appearing HEENT: EOMI, MMM conjunctiva injection and clear discharge bilateral eyes. Clear nasal discharge present. Lungs: Normal work of breathing. Coarse breath sounds and wheezing present bilaterally Heart: Mild Tachycardia but regular rhythm present no MRG Abd: NABS, Soft. Nondistended, Nontender Exts: Brisk capillary refill, warm and well perfused.     No results found for  this or any previous visit (from the past 72 hour(s)). Dg Chest 2 View  Result Date: 08/15/2016 CLINICAL DATA:  Nonproductive cough and fever for the past 2 days. History of asthma. EXAM: CHEST  2 VIEW COMPARISON:  None in PACs FINDINGS: The lungs are mildly hyperinflated. The interstitial markings are coarse. There is no alveolar infiltrate. There is no pleural effusion. The heart and pulmonary vascularity are normal. The mediastinum is normal in width. The bony thorax exhibits no acute abnormality. IMPRESSION: Coarse lung markings bilaterally may  reflect acute exacerbation of known reactive airway disease or reflect superimposed acute bronchitis. There is no alveolar pneumonia nor CHF. Electronically Signed   By: David  Martinique M.D.   On: 08/15/2016 16:15      Assessment and Plan: 51 y.o. female with influenza as the most likely explanation for illness. This is complicated by her history of asthma. She appears to be doing reasonably well from pulmonary perspective today. She does have wheezing and prolonged recovery phase on exam and does show evidence of asthma or bronchitis changes on chest x-ray. Plan to treat with Tamiflu Atrovent nasal spray over-the-counter Tylenol or ibuprofen as well as codeine-based cough medications. Additionally I prescribed a course of prednisone patient can take if she feels as though her asthma is worsening. I would like to avoid prednisone in this setting if. We had a lengthy discussion about when to follow-up in one to go to the emergency room.   Orders Placed This Encounter  Procedures  . DG Chest 2 View    Order Specific Question:   Reason for exam:    Answer:   Fever wheezing suspect flu PNA?    Order Specific Question:   Is the patient pregnant?    Answer:   No    Order Specific Question:   Preferred imaging location?    Answer:   [facility]    Discussed warning signs or symptoms. Please see discharge instructions. Patient expresses understanding.

## 2016-08-15 NOTE — Patient Instructions (Signed)
Thank you for coming in today. Take tamiflu Use the nasal spray and cough medicine as needed.  Continue inhalers.  Recheck in 2 days or sooner if needed.  Call or go to the emergency room if you get worse, have trouble breathing, have chest pains, or palpitations.   Take prednisone if you feel like you need it.    Influenza, Adult Influenza, more commonly known as "the flu," is a viral infection that primarily affects the respiratory tract. The respiratory tract includes organs that help you breathe, such as the lungs, nose, and throat. The flu causes many common cold symptoms, as well as a high fever and body aches. The flu spreads easily from person to person (is contagious). Getting a flu shot (influenza vaccination) every year is the best way to prevent influenza. What are the causes? Influenza is caused by a virus. You can catch the virus by:  Breathing in droplets from an infected person's cough or sneeze.  Touching something that was recently contaminated with the virus and then touching your mouth, nose, or eyes. What increases the risk? The following factors may make you more likely to get the flu:  Not cleaning your hands frequently with soap and water or alcohol-based hand sanitizer.  Having close contact with many people during cold and flu season.  Touching your mouth, eyes, or nose without washing or sanitizing your hands first.  Not drinking enough fluids or not eating a healthy diet.  Not getting enough sleep or exercise.  Being under a high amount of stress.  Not getting a yearly (annual) flu shot. You may be at a higher risk of complications from the flu, such as a severe lung infection (pneumonia), if you:  Are over the age of 13.  Are pregnant.  Have a weakened disease-fighting system (immune system). You may have a weakened immune system if you:  Have HIV or AIDS.  Are undergoing chemotherapy.  Aretaking medicines that reduce the activity of  (suppress) the immune system.  Have a long-term (chronic) illness, such as heart disease, kidney disease, diabetes, or lung disease.  Have a liver disorder.  Are obese.  Have anemia. What are the signs or symptoms? Symptoms of this condition typically last 4-10 days and may include:  Fever.  Chills.  Headache, body aches, or muscle aches.  Sore throat.  Cough.  Runny or congested nose.  Chest discomfort and cough.  Poor appetite.  Weakness or tiredness (fatigue).  Dizziness.  Nausea or vomiting. How is this diagnosed? This condition may be diagnosed based on your medical history and a physical exam. Your health care provider may do a nose or throat swab test to confirm the diagnosis. How is this treated? If influenza is detected early, you can be treated with antiviral medicine that can reduce the length of your illness and the severity of your symptoms. This medicine may be given by mouth (orally) or through an IV tube that is inserted in one of your veins. The goal of treatment is to relieve symptoms by taking care of yourself at home. This may include taking over-the-counter medicines, drinking plenty of fluids, and adding humidity to the air in your home. In some cases, influenza goes away on its own. Severe influenza or complications from influenza may be treated in a hospital. Follow these instructions at home:  Take over-the-counter and prescription medicines only as told by your health care provider.  Use a cool mist humidifier to add humidity to the air in  your home. This can make breathing easier.  Rest as needed.  Drink enough fluid to keep your urine clear or pale yellow.  Cover your mouth and nose when you cough or sneeze.  Wash your hands with soap and water often, especially after you cough or sneeze. If soap and water are not available, use hand sanitizer.  Stay home from work or school as told by your health care provider. Unless you are visiting  your health care provider, try to avoid leaving home until your fever has been gone for 24 hours without the use of medicine.  Keep all follow-up visits as told by your health care provider. This is important. How is this prevented?  Getting an annual flu shot is the best way to avoid getting the flu. You may get the flu shot in late summer, fall, or winter. Ask your health care provider when you should get your flu shot.  Wash your hands often or use hand sanitizer often.  Avoid contact with people who are sick during cold and flu season.  Eat a healthy diet, drink plenty of fluids, get enough sleep, and exercise regularly. Contact a health care provider if:  You develop new symptoms.  You have:  Chest pain.  Diarrhea.  A fever.  Your cough gets worse.  You produce more mucus.  You feel nauseous or you vomit. Get help right away if:  You develop shortness of breath or difficulty breathing.  Your skin or nails turn a bluish color.  You have severe pain or stiffness in your neck.  You develop a sudden headache or sudden pain in your face or ear.  You cannot stop vomiting. This information is not intended to replace advice given to you by your health care provider. Make sure you discuss any questions you have with your health care provider. Document Released: 06/29/2000 Document Revised: 12/08/2015 Document Reviewed: 04/26/2015 Elsevier Interactive Patient Education  2017 [employer].

## 2016-08-17 ENCOUNTER — Telehealth: Payer: Self-pay | Admitting: *Deleted

## 2016-08-17 MED ORDER — PROMETHAZINE HCL 25 MG PO TABS: 25.0000 mg | ORAL_TABLET | Freq: Four times a day (QID) | ORAL | 2 refills | Status: DC | PRN

## 2016-08-17 NOTE — Telephone Encounter (Signed)
Phenergan sent in

## 2016-08-17 NOTE — Telephone Encounter (Signed)
Patient has been diagnosed with the flu and now she has nausea and  is vomiting. Can you please send phenergan to her pharm?

## 2016-09-08 ENCOUNTER — Encounter: Payer: Self-pay | Admitting: Emergency Medicine

## 2016-09-08 ENCOUNTER — Emergency Department
Admission: EM | Admit: 2016-09-08 | Discharge: 2016-09-08 | Disposition: A | Discharge status: Home / Self Care | Payer: BC Managed Care – PPO | Source: Home / Self Care | Attending: Family Medicine | Admitting: Family Medicine

## 2016-09-08 ENCOUNTER — Emergency Department (INDEPENDENT_AMBULATORY_CARE_PROVIDER_SITE_OTHER): Payer: BC Managed Care – PPO

## 2016-09-08 DIAGNOSIS — J189 Pneumonia, unspecified organism: Secondary | ICD-10-CM

## 2016-09-08 DIAGNOSIS — J181 Lobar pneumonia, unspecified organism: Secondary | ICD-10-CM

## 2016-09-08 MED ORDER — AZITHROMYCIN 250 MG PO TABS: 250.0000 mg | ORAL_TABLET | Freq: Every day | ORAL | 0 refills | Status: DC

## 2016-09-08 MED ORDER — CEFTRIAXONE SODIUM 1 G IJ SOLR
1.0000 g | Freq: Once | INTRAMUSCULAR | Status: AC
  Administered 2016-09-08: 1 g via INTRAMUSCULAR

## 2016-09-08 MED ORDER — BENZONATATE 100 MG PO CAPS: 100.0000 mg | ORAL_CAPSULE | Freq: Three times a day (TID) | ORAL | 0 refills | Status: DC

## 2016-09-08 NOTE — ED Provider Notes (Signed)
CSN: [id number]     Arrival date & time 09/08/16  [id number] History   First MD Initiated Contact with Patient 09/08/16 1006     Chief Complaint  Patient presents with  . Cough  . Nasal Congestion  . Fever   (Consider location/radiation/quality/duration/timing/severity/associated sxs/prior Treatment) HPI [name] is a 51 y.o. female presenting to [facility] with c/o worsening cough, congestion, fatigue, and subjective fever.  She was dx with the flu 3 weeks ago, took Tamiflu but has worsened this past week. She has been coughing up yellow-green sputum.  Hx of asthma. She has been taking her inhalers but no relief. She was seen by her PCP yesterday and received a shot of Kenolog, which usually helps, but she feels even worse today.  Denies vomiting or diarrhea but has had some nausea.    Past Medical History:  Diagnosis Date  . Asthma   . Esophageal stricture 12/16/2012   Dilated Spring 2014   . Irregular menstrual bleeding 02/27/2011  . Tachycardia    Past Surgical History:  Procedure Laterality Date  . CESAREAN SECTION     x 2 last in 99  . NASAL POLYP SURGERY    . TUBAL LIGATION  2000   Family History  Problem Relation Age of Onset  . Diabetes Mother   . Hypertension Mother    Social History  Substance Use Topics  . Smoking status: Never Smoker  . Smokeless tobacco: Never Used  . Alcohol use Yes     Comment: occasionally   OB History    Gravida Para Term Preterm AB Living   2 2       2    SAB TAB Ectopic Multiple Live Births                 Review of Systems  Constitutional: Positive for appetite change, chills, fatigue and fever.  HENT: Positive for congestion, ear pain ( fullness), rhinorrhea and sore throat. Negative for trouble swallowing and voice change.   Respiratory: Positive for cough and chest tightness. Negative for shortness of breath.   Cardiovascular: Negative for chest pain and palpitations.  Gastrointestinal: Positive for nausea. Negative for abdominal  pain, diarrhea and vomiting.  Musculoskeletal: Positive for arthralgias, back pain and myalgias.  Skin: Negative for rash.  Neurological: Positive for weakness ( generalized) and headaches. Negative for dizziness and light-headedness.    Allergies  Amoxicillin  Home Medications   Prior to Admission medications   Medication Sig Start Date End Date Taking? Authorizing Provider  albuterol-ipratropium (COMBIVENT) 18-103 MCG/ACT inhaler Inhale 1 puff into the lungs every 4 (four) hours.    Historical Provider, MD  azithromycin (ZITHROMAX) 250 MG tablet Take 1 tablet (250 mg total) by mouth daily. Take first 2 tablets together, then 1 every day until finished. 09/08/16   Noland Fordyce, PA-C  benzonatate (TESSALON) 100 MG capsule Take 1-2 capsules (100-200 mg total) by mouth every 8 (eight) hours. 09/08/16   Noland Fordyce, PA-C  budesonide (PULMICORT) 0.5 MG/2ML nebulizer solution  02/25/16   Historical Provider, MD  BUDESONIDE NA Place into the nose.    Historical Provider, MD  fluticasone (FLONASE) 50 MCG/ACT nasal spray 2 SPRAYS INTO EACH NOSTRIL ONCE DAILY OR 1 SPRAY INTO EACH NOSTRIL TWICE DAILY. 11/01/15   Historical Provider, MD  guaiFENesin-codeine 100-10 MG/5ML syrup Take 5 mLs by mouth at bedtime as needed for cough. 08/15/16   Gregor Hams, MD  ipratropium (ATROVENT) 0.06 % nasal spray Place 2 sprays into both nostrils  every 4 (four) hours as needed for rhinitis. 08/15/16   Gregor Hams, MD  metoprolol succinate (TOPROL-XL) 25 MG 24 hr tablet TAKE 1 TABLET (25 MG TOTAL) BY MOUTH DAILY. PLEASE MAKE APPOINTMENT WITH NEW PCP. 07/19/16   Donella Stade, PA-C  mometasone-formoterol (DULERA) 200-5 MCG/ACT AERO Inhale into the lungs. 08/17/15   Historical Provider, MD  oseltamivir (TAMIFLU) 75 MG capsule Take 1 capsule (75 mg total) by mouth 2 (two) times daily. 08/15/16   Gregor Hams, MD  pantoprazole (PROTONIX) 40 MG tablet Take 1 tablet (40 mg total) by mouth daily. 01/02/16   Marcial Pacas, DO   predniSONE (DELTASONE) 10 MG tablet Take 3 tablets (30 mg total) by mouth daily with breakfast. 08/15/16   Gregor Hams, MD  promethazine (PHENERGAN) 25 MG tablet Take 1 tablet (25 mg total) by mouth every 6 (six) hours as needed for nausea or vomiting. 08/17/16   Gregor Hams, MD  tiotropium (SPIRIVA) 18 MCG inhalation capsule Place 18 mcg into inhaler and inhale daily.    Historical Provider, MD   Meds Ordered and Administered this Visit   Medications  cefTRIAXone (ROCEPHIN) injection 1 g (1 g Intramuscular Given 09/08/16 1050)    BP 130/86 (BP Location: Left Arm)   Pulse 110   Temp 99.2 F (37.3 C) (Oral)   Resp 16   Ht 5' 7.5" (1.715 m)   Wt 140 lb (63.5 kg)   LMP 09/07/2016   SpO2 99%   BMI 21.60 kg/m  No data found.   Physical Exam  Constitutional: She is oriented to person, place, and time. She appears well-developed and well-nourished. No distress.  Pt sitting on exam bed, appears mildly fatigued, acutely ill but non-toxic. NAD. Alert and cooperative during exam  HENT:  Head: Normocephalic and atraumatic.  Right Ear: Tympanic membrane normal.  Left Ear: Tympanic membrane normal.  Nose: Nose normal.  Mouth/Throat: Uvula is midline, oropharynx is clear and moist and mucous membranes are normal.  Eyes: EOM are normal.  Neck: Normal range of motion. Neck supple.  Cardiovascular: Normal rate and regular rhythm.   Pulmonary/Chest: Effort normal. No stridor. No respiratory distress. She has no decreased breath sounds. She has no wheezes. She has rhonchi. She has rales.  Diffuse coarse breath sounds. Intermittent productive cough throughout exam but able to speak in full sentences. No accessory muscle use  Musculoskeletal: Normal range of motion.  Lymphadenopathy:    She has no cervical adenopathy.  Neurological: She is alert and oriented to person, place, and time.  Skin: Skin is warm and dry. She is not diaphoretic.  Psychiatric: She has a normal mood and affect. Her  behavior is normal.  Nursing note and vitals reviewed.   Urgent Care Course     Procedures (including critical care time)  Labs Review Labs Reviewed - No data to display  Imaging Review Dg Chest 2 View  Result Date: 09/08/2016 CLINICAL DATA:  51 year old female with productive cough EXAM: CHEST  2 VIEW COMPARISON:  Prior chest x-ray 08/15/2016 FINDINGS: Interval development of patchy airspace opacity in a peribronchovascular distribution in the medial aspect of the left lower lobe. The findings are most consistent with bronchopneumonia. The remaining lung fields remain unchanged. The cardiac and mediastinal contours remain within normal limits. No pleural effusion or pneumothorax. Osseous structures remain intact and unremarkable. IMPRESSION: Left lower lobe bronchopneumonia. Electronically Signed   By: Jacqulynn Cadet M.D.   On: 09/08/2016 10:31     MDM  1. Pneumonia of left lower lobe due to infectious organism ([employer])    Hx and exam c/w Left lower lobe pneumonia.  Tx in UC: Rocephin 1g IM   Rx: Azithromycin and tessalon  Encouraged f/u with PCP later this week if not improving. Discussed symptoms that warrant emergent care in the ED.     Noland Fordyce, PA-C 09/08/16 1123

## 2016-09-08 NOTE — ED Notes (Signed)
No adverse reaction to Rocephin 1 Gram. Patient D/C without problems

## 2016-09-08 NOTE — Discharge Instructions (Signed)
You may take 400-600mg  Ibuprofen (Motrin) every 6-8 hours for fever and pain  Alternate with Tylenol  You may take 500mg  Tylenol every 4-6 hours as needed for fever and pain  Follow-up with your primary care provider next week for recheck of symptoms if not improving.  Be sure to drink plenty of fluids and rest, at least 8hrs of sleep a night, preferably more while you are sick. Return urgent care or go to closest ER if you cannot keep down fluids/signs of dehydration, fever not reducing with Tylenol, difficulty breathing/wheezing, stiff neck, worsening condition, or other concerns (see below)  Please take antibiotics as prescribed and be sure to complete entire course even if you start to feel better to ensure infection does not come back.  You were given a shot of ceftriaxone (Rocephin), an antibiotic to help with the pneumonia.  You have also been prescribed azithromycin antibiotic pills (a z-pak) to help treat the pneumonia.  You may start this antibiotic later today at dinner to help prevent stomach upset from the rocephin given this morning.

## 2016-09-08 NOTE — ED Triage Notes (Signed)
Patient presents to [facility] with C/O flu like symptoms since Tuesday, Patient advised that she was diagnosis with flu 3 weeks ago. And took Tamiflu. Symptoms worsened on Tuesday 09/04/2016. Productive cough with yellowish green sputum. Fever generalized weakness and fatigue, History of asthma, patient advised that she seen her PCP and was given an injection of Kenolog.

## 2016-09-11 ENCOUNTER — Encounter: Payer: Self-pay | Admitting: Physician Assistant

## 2016-09-11 ENCOUNTER — Ambulatory Visit (INDEPENDENT_AMBULATORY_CARE_PROVIDER_SITE_OTHER): Payer: BC Managed Care – PPO | Admitting: Physician Assistant

## 2016-09-11 VITALS — BP 135/79 | HR 102 | Temp 98.6°F | Ht 67.0 in | Wt 138.0 lb

## 2016-09-11 DIAGNOSIS — J189 Pneumonia, unspecified organism: Secondary | ICD-10-CM

## 2016-09-11 DIAGNOSIS — J181 Lobar pneumonia, unspecified organism: Secondary | ICD-10-CM | POA: Diagnosis not present

## 2016-09-11 MED ORDER — METHYLPREDNISOLONE ACETATE 80 MG/ML IJ SUSP
80.0000 mg | Freq: Once | INTRAMUSCULAR | Status: AC
  Administered 2016-09-11: 80 mg via INTRAMUSCULAR

## 2016-09-11 MED ORDER — CEFTRIAXONE SODIUM 1 G IJ SOLR: 1.0000 g | Freq: Once | INTRAMUSCULAR | 0 refills | Status: AC

## 2016-09-11 MED ORDER — LEVOFLOXACIN 750 MG PO TABS: 750.0000 mg | ORAL_TABLET | Freq: Every day | ORAL | 0 refills | Status: DC

## 2016-09-11 MED ORDER — CEFTRIAXONE SODIUM 1 G IJ SOLR
1.0000 g | Freq: Once | INTRAMUSCULAR | Status: AC
  Administered 2016-09-11: 1 g via INTRAMUSCULAR

## 2016-09-11 NOTE — Patient Instructions (Signed)
Depo medrol 80mg  given in office.  Rocephin 1g in office today.  Stop zpak.  Start levaquin.   Follow up in 7 days.

## 2016-09-11 NOTE — Progress Notes (Signed)
   Subjective:    Patient ID: [name], female    DOB: 09-03-1965, 51 y.o.   MRN: [id number]  HPI  Pt is a 51 yo female with pmh of asthma who presents to the clinic for pneumonia of left lower quadrant. She was dx in UC on 09/08/16. She was given 1g rochephin, zpak, tessalon pearls. She continues to use symbicort, spiriva, and combivient. She denies any fever but very little improvement. She is coughing, weak, SOB and wheezing. She is not taking any OTC medications.     Review of Systems See HPI.     Objective:   Physical Exam  Constitutional: She is oriented to person, place, and time. She appears well-developed and well-nourished.  HENT:  Head: Normocephalic and atraumatic.  Right Ear: External ear normal.  Left Ear: External ear normal.  Nose: Nose normal.  Mouth/Throat: Oropharynx is clear and moist. No oropharyngeal exudate.  Eyes: Conjunctivae are normal.  Neck: Normal range of motion. Neck supple.  Cardiovascular: Normal heart sounds.   102 tachycardia.  Pulmonary/Chest:  decreased effort.  Coughing with every breath.  Wheezing, rhonchi and crackles bilateral lungs worse at base.   Lymphadenopathy:    She has no cervical adenopathy.  Neurological: She is alert and oriented to person, place, and time.  Psychiatric: She has a normal mood and affect. Her behavior is normal.          Assessment & Plan:  Marland KitchenMarland KitchenDiagnoses and all orders for this visit:  Pneumonia of left lower lobe due to infectious organism ([place]) -     levofloxacin (LEVAQUIN) 750 MG tablet; Take 1 tablet (750 mg total) by mouth daily. For 7 days. -     cefTRIAXone (ROCEPHIN) 1 g injection; Inject 1 g into the muscle once. -     methylPREDNISolone acetate (DEPO-MEDROL) injection 80 mg; Inject 1 mL (80 mg total) into the muscle once. -     cefTRIAXone (ROCEPHIN) injection 1 g; Inject 1 g into the muscle once.   Stop zpak.  Start levaquin.  Pt declined oral prednisone.  Depo medrol 80mg  given today  with 1g of rocephin.  Follow up in 7 days or sooner if no improvement.  Continue on preventative inhalers and combivent as needed.

## 2016-09-12 ENCOUNTER — Ambulatory Visit: Payer: BC Managed Care – PPO | Admitting: Physician Assistant

## 2016-09-18 ENCOUNTER — Encounter: Payer: Self-pay | Admitting: Physician Assistant

## 2016-09-18 ENCOUNTER — Ambulatory Visit (INDEPENDENT_AMBULATORY_CARE_PROVIDER_SITE_OTHER): Payer: BC Managed Care – PPO | Admitting: Physician Assistant

## 2016-09-18 ENCOUNTER — Ambulatory Visit (INDEPENDENT_AMBULATORY_CARE_PROVIDER_SITE_OTHER): Payer: BC Managed Care – PPO

## 2016-09-18 ENCOUNTER — Ambulatory Visit: Payer: BC Managed Care – PPO | Admitting: Physician Assistant

## 2016-09-18 VITALS — BP 120/80 | HR 94 | Ht 67.0 in | Wt 138.0 lb

## 2016-09-18 DIAGNOSIS — J4531 Mild persistent asthma with (acute) exacerbation: Secondary | ICD-10-CM | POA: Diagnosis not present

## 2016-09-18 DIAGNOSIS — J181 Lobar pneumonia, unspecified organism: Secondary | ICD-10-CM

## 2016-09-18 DIAGNOSIS — J189 Pneumonia, unspecified organism: Secondary | ICD-10-CM

## 2016-09-18 NOTE — Progress Notes (Signed)
Call pt: improving pneumonia Please fax report to [facility] pulmonologist at [facility].

## 2016-09-18 NOTE — Progress Notes (Deleted)
d 

## 2016-09-18 NOTE — Progress Notes (Addendum)
Subjective:     Patient ID: [name], female   DOB: 01/05/1966, 51 y.o.   MRN: [id number]  HPI  This is a 51 year old female who presents today for follow up of pneumonia. Diagnosed with pneumonia on 09/08/16 and given 1g rocephin, zpak, and tessalon pearls. Presented here 09/10/16 and was given IM Depomedrol, additional 1g Rocephin, and started on Levaquin. States her cough has improved however it is still productive and she can feel herself wheezing. Appointment with Pulmonologist in 2 days.  Has been using Spiriva and Symbicort daily with Combivent as rescue. Unsure if inhalers have made a difference because she is unable to take a deep breath when administering it.  Not using her nebulized budesonide.  Denies fevers, chills, shortness of breath, chest pain, leg edema.  Review of Systems See HPI.    Objective:   Physical Exam  Constitutional: She is oriented to person, place, and time. She appears well-developed and well-nourished.  HENT:  Head: Normocephalic and atraumatic.  Right Ear: External ear normal.  Left Ear: External ear normal.  Mouth/Throat: Oropharynx is clear and moist.  Neck: Normal range of motion. Neck supple. No tracheal deviation present. No thyromegaly present.  Cardiovascular: Normal rate and regular rhythm.  Exam reveals no gallop and no friction rub.   No murmur heard. Pulmonary/Chest: Effort normal.  Wheezes and rhonchi heard on exam in all four lung fields, worse on the right side. No crackles heard.  Musculoskeletal: Normal range of motion. She exhibits no edema.  Lymphadenopathy:    She has no cervical adenopathy.  Neurological: She is alert and oriented to person, place, and time.  Skin: Skin is warm and dry.  Psychiatric: She has a normal mood and affect. Her behavior is normal.       Assessment/Plan:  Diagnoses and all orders for this visit:  Community acquired pneumonia of left lower lobe of lung ([place]) -     DG Chest 2 View  Mild  persistent chronic asthma with acute exacerbation  Reorder chest x ray to check for resolution of pneumonia. Will prescribe additional antibiotics if there is still evidence of infection.  Vitals reassuring today.    I do think coughing and wheezing is more so related to asthma exacerbation and bronchitis, however. Patient has already received IM depomedrol and is taking symbicort and spiriva daily. Encouraged patient to begin using combivent more regularly as this may help her cough up an mucus in her lungs. Practice deep breathing exercises hourly. Will defer further asthma management to Pulmonologist who she will see in 2 days.  Will send copy of CXR to Soledad Gerlach Pulmonologist at [facility].

## 2016-10-14 ENCOUNTER — Other Ambulatory Visit: Payer: Self-pay | Admitting: Physician Assistant

## 2016-11-14 ENCOUNTER — Encounter: Payer: Self-pay | Admitting: Physician Assistant

## 2016-11-14 ENCOUNTER — Ambulatory Visit (INDEPENDENT_AMBULATORY_CARE_PROVIDER_SITE_OTHER): Payer: BC Managed Care – PPO | Admitting: Physician Assistant

## 2016-11-14 VITALS — BP 135/91 | HR 94 | Ht 67.0 in | Wt 136.0 lb

## 2016-11-14 DIAGNOSIS — R202 Paresthesia of skin: Secondary | ICD-10-CM

## 2016-11-14 DIAGNOSIS — F458 Other somatoform disorders: Secondary | ICD-10-CM

## 2016-11-14 DIAGNOSIS — R201 Hypoesthesia of skin: Secondary | ICD-10-CM | POA: Diagnosis not present

## 2016-11-14 LAB — SEDIMENTATION RATE: Sed Rate: 14 mm/hr (ref 0–30)

## 2016-11-14 LAB — CBC
HEMATOCRIT: 38.6 % (ref 35.0–45.0)
HEMOGLOBIN: 12.5 g/dL (ref 11.7–15.5)
MCH: 28.7 pg (ref 27.0–33.0)
MCHC: 32.4 g/dL (ref 32.0–36.0)
MCV: 88.5 fL (ref 80.0–100.0)
MPV: 10.3 fL (ref 7.5–12.5)
Platelets: 300 10*3/uL (ref 140–400)
RBC: 4.36 MIL/uL (ref 3.80–5.10)
RDW: 14.6 % (ref 11.0–15.0)
WBC: 7 10*3/uL (ref 3.8–10.8)

## 2016-11-14 LAB — HEPATIC FUNCTION PANEL
ALBUMIN: 4 g/dL (ref 3.6–5.1)
ALT: 13 U/L (ref 6–29)
AST: 16 U/L (ref 10–35)
Alkaline Phosphatase: 53 U/L (ref 33–130)
BILIRUBIN TOTAL: 0.5 mg/dL (ref 0.2–1.2)
Bilirubin, Direct: 0.1 mg/dL (ref <=0.2)
Indirect Bilirubin: 0.4 mg/dL (ref 0.2–1.2)
Total Protein: 7 g/dL (ref 6.1–8.1)

## 2016-11-14 MED ORDER — PREDNISONE 20 MG PO TABS: ORAL_TABLET | ORAL | 0 refills | Status: DC

## 2016-11-14 MED ORDER — CYCLOBENZAPRINE HCL 10 MG PO TABS: 10.0000 mg | ORAL_TABLET | Freq: Every day | ORAL | 0 refills | Status: DC

## 2016-11-14 NOTE — Progress Notes (Signed)
   Subjective:    Patient ID: [name], female    DOB: [birth year], 51 y.o.   MRN: [id number]  HPI  Pt is a 51 yo female who presents to the clinic with tingling and burning sensation in right side parietal region of head that at times has started to wrap around to the left for the last 2 weeks. She denies any rash, headache, sinus pressure, dizziness, or vision changes. She had right upper molar tooth extraction last Wednesday due to cracked and decayed tooth. She thought it was due to nerve pain from tooth. It has not gotten any better. She has been on clindamycin for a little over 14 days but that is the only new medication.her dentist has told her she is teeth grinding She does not have a hx of migraines. Ibuprofen does help symptoms.       Review of Systems See HPI.     Objective:   Physical Exam  Constitutional: She is oriented to person, place, and time. She appears well-developed and well-nourished.  HENT:  Head: Normocephalic and atraumatic.  Right Ear: External ear normal.  Left Ear: External ear normal.  Negative for any bilateral temporal tenderness to palpation.  No tenderness over TMJ to palpation.   Eyes: Conjunctivae and EOM are normal. Pupils are equal, round, and reactive to light. Right eye exhibits no discharge. Left eye exhibits no discharge.  Neck: Normal range of motion. Neck supple.  Cardiovascular: Normal rate, regular rhythm and normal heart sounds.   Pulmonary/Chest: Effort normal and breath sounds normal.  Neurological: She is alert and oriented to person, place, and time. She has normal reflexes. She displays normal reflexes. No cranial nerve deficit. She exhibits normal muscle tone. Coordination normal.  Normal Gait, upper and lower strength, rapid alternating movements intact, finger to nose intact.   Psychiatric: She has a normal mood and affect. Her behavior is normal.          Assessment & Plan:  Marland KitchenMarland KitchenDiagnoses and all orders for this  visit:  Tingling of face -     Sed Rate (ESR) -     C-reactive protein -     CBC -     Hepatic function panel  Teeth grinding  Other orders -     cyclobenzaprine (FLEXERIL) 10 MG tablet; Take 1 tablet (10 mg total) by mouth at bedtime. -     predniSONE (DELTASONE) 20 MG tablet; Take 3 tablets for 3 days, take 2 tablets for 3 days, take 1 tablet for 3 days, take 1/2 tablet for 4 days.   Concern for temporal arteritis; however, she does not have any temporal tenderness, vision changes, headache. Will rule out with ESR. I suspect a nerve could be inflamed from tooth procedure had done. Prednisone taper given. Muscle relaxer since patient is grinding teeth could take at night and see if helps with teeth grinding as well and relaxes muscle perhaps if tingling nerve pain is linked to muscle strain. Pt already has a mouth guard.

## 2016-11-14 NOTE — Progress Notes (Signed)
1 

## 2016-11-14 NOTE — Progress Notes (Signed)
135/91 

## 2016-11-15 LAB — C-REACTIVE PROTEIN: CRP: 0.6 mg/L (ref <8.0)

## 2016-11-16 DIAGNOSIS — F458 Other somatoform disorders: Secondary | ICD-10-CM | POA: Insufficient documentation

## 2016-11-16 DIAGNOSIS — R202 Paresthesia of skin: Secondary | ICD-10-CM | POA: Insufficient documentation

## 2017-01-03 ENCOUNTER — Other Ambulatory Visit: Payer: Self-pay | Admitting: Physician Assistant

## 2017-01-03 DIAGNOSIS — Z1231 Encounter for screening mammogram for malignant neoplasm of breast: Secondary | ICD-10-CM

## 2017-01-17 ENCOUNTER — Other Ambulatory Visit: Payer: Self-pay | Admitting: Physician Assistant

## 2017-01-18 ENCOUNTER — Telehealth: Payer: Self-pay | Admitting: Physician Assistant

## 2017-01-18 NOTE — Telephone Encounter (Signed)
Called pt and left a message for her to call our office and schedule her CPE with Luvenia Starch for the end of August

## 2017-02-12 ENCOUNTER — Ambulatory Visit (INDEPENDENT_AMBULATORY_CARE_PROVIDER_SITE_OTHER): Payer: BC Managed Care – PPO

## 2017-02-12 ENCOUNTER — Other Ambulatory Visit: Payer: Self-pay | Admitting: Physician Assistant

## 2017-02-12 DIAGNOSIS — Z1231 Encounter for screening mammogram for malignant neoplasm of breast: Secondary | ICD-10-CM

## 2017-02-23 ENCOUNTER — Emergency Department
Admission: EM | Admit: 2017-02-23 | Discharge: 2017-02-23 | Disposition: A | Discharge status: Home / Self Care | Payer: BC Managed Care – PPO | Source: Home / Self Care | Attending: Family Medicine | Admitting: Family Medicine

## 2017-02-23 DIAGNOSIS — R630 Anorexia: Secondary | ICD-10-CM

## 2017-02-23 DIAGNOSIS — M549 Dorsalgia, unspecified: Secondary | ICD-10-CM

## 2017-02-23 DIAGNOSIS — M545 Low back pain, unspecified: Secondary | ICD-10-CM

## 2017-02-23 DIAGNOSIS — R109 Unspecified abdominal pain: Secondary | ICD-10-CM

## 2017-02-23 LAB — TSH: TSH: 1.5 mIU/L

## 2017-02-23 LAB — COMPLETE METABOLIC PANEL WITH GFR
ALT: 11 U/L (ref 6–29)
AST: 16 U/L (ref 10–35)
Albumin: 4.3 g/dL (ref 3.6–5.1)
Alkaline Phosphatase: 54 U/L (ref 33–130)
BUN: 14 mg/dL (ref 7–25)
CO2: 26 mmol/L (ref 20–32)
Calcium: 9.6 mg/dL (ref 8.6–10.4)
Chloride: 104 mmol/L (ref 98–110)
Creat: 0.82 mg/dL (ref 0.50–1.05)
GFR, Est African American: >89 mL/min (ref >=60)
GFR, Est Non African American: 83 mL/min (ref >=60)
Glucose, Bld: 113 mg/dL — ABNORMAL HIGH (ref 65–99)
Potassium: 4.3 mmol/L (ref 3.5–5.3)
Sodium: 139 mmol/L (ref 135–146)
Total Bilirubin: 0.5 mg/dL (ref 0.2–1.2)
Total Protein: 7.3 g/dL (ref 6.1–8.1)

## 2017-02-23 LAB — POCT CBC W AUTO DIFF (K'VILLE URGENT CARE)

## 2017-02-23 LAB — POCT URINALYSIS DIP (MANUAL ENTRY)
Bilirubin, UA: NEGATIVE
Glucose, UA: NEGATIVE mg/dL
Ketones, POC UA: NEGATIVE mg/dL
Leukocytes, UA: NEGATIVE
Nitrite, UA: NEGATIVE
Protein Ur, POC: NEGATIVE mg/dL
Spec Grav, UA: 1.02 (ref 1.010–1.025)
Urobilinogen, UA: 0.2 E.U./dL
pH, UA: 6.5 (ref 5.0–8.0)

## 2017-02-23 NOTE — ED Triage Notes (Signed)
Diego complains of stomach growling and loss of appetite for 2 weeks. She has had stomach cramping for 5 days and back pain for 2 days.

## 2017-02-23 NOTE — Discharge Instructions (Signed)
°  If you continue to have stomach upset- cramping or growling, you may want to try a bland diet.  Be sure to stay well hydrated.  The rest of your labs for CMP (kidney and liver function, and electrolytes), and TSH (thyroid) should be back in about 2 days.  Our office will call you with results.

## 2017-02-23 NOTE — ED Provider Notes (Signed)
[name]    CSN: [id number] Arrival date & time: 02/23/17  1138     History   Chief Complaint Chief Complaint  Patient presents with  . Back Pain  . Anorexia    HPI Landry Lookingbill is a 51 y.o. female.   HPI Angel Hobdy is a 51 y.o. female presenting to UC with c/o 2 weeks of loss of appetite and "stomach growling" with mild intermittent cramping.  She states she has forced herself to eat and despite eating a steak and baked potato her stomach has still growled.  Denies fever, chills, n/v/d.  She does f/u routinely with her PCP, Iran Planas, PA-C. She has a f/u with her in September.  LMP: 01/13/17, going through menopause, had some spotting yesterday and scant amount when wiping today.   BP and HR elevated in triage. Pt notes she forgot to take her metoprolol today and also notes she came over after moving her daughter into her dorm room this morning.   Past Medical History:  Diagnosis Date  . Asthma   . Esophageal stricture 12/16/2012   Dilated Spring 2014   . Irregular menstrual bleeding 02/27/2011  . Tachycardia     Patient Active Problem List   Diagnosis Date Noted  . Teeth grinding 11/16/2016  . Tingling of face 11/16/2016  . Left breast mass 06/04/2016  . Cerumen impaction 03/28/2016  . Nasal polyposis 03/25/2014  . Asthma, chronic 02/09/2014  . Constipation 09/21/2013  . Allergic rhinitis 09/21/2013  . Paroxysmal supraventricular tachycardia ([place]) 01/28/2013  . Anemia 12/22/2012  . Esophageal stricture 12/16/2012  . Irregular menstrual bleeding 02/27/2011    Past Surgical History:  Procedure Laterality Date  . CESAREAN SECTION     x 2 last in 99  . NASAL POLYP SURGERY    . TUBAL LIGATION  2000    OB History    Gravida Para Term Preterm AB Living   2 2       2    SAB TAB Ectopic Multiple Live Births                   Home Medications    Prior to Admission medications   Medication Sig Start Date End Date Taking? Authorizing  Provider  albuterol-ipratropium (COMBIVENT) 18-103 MCG/ACT inhaler Inhale 1 puff into the lungs every 4 (four) hours.   Yes [provider]  budesonide (PULMICORT) 0.5 MG/2ML nebulizer solution  02/25/16  Yes [provider]  BUDESONIDE NA Place into the nose.   Yes [provider]  budesonide-formoterol (SYMBICORT) 160-4.5 MCG/ACT inhaler Inhale into the lungs. 07/19/16  Yes [provider]  cyclobenzaprine (FLEXERIL) 10 MG tablet Take 1 tablet (10 mg total) by mouth at bedtime. 11/14/16  Yes Breeback, Jade L, PA-C  metoprolol succinate (TOPROL-XL) 25 MG 24 hr tablet TAKE 1 TABLET (25 MG TOTAL) BY MOUTH DAILY. 01/18/17  Yes Breeback, Jade L, PA-C  tiotropium (SPIRIVA) 18 MCG inhalation capsule Place 18 mcg into inhaler and inhale daily.   Yes [provider]  predniSONE (DELTASONE) 20 MG tablet Take 3 tablets for 3 days, take 2 tablets for 3 days, take 1 tablet for 3 days, take 1/2 tablet for 4 days. 11/14/16   Donella Stade, PA-C    Family History Family History  Problem Relation Age of Onset  . Diabetes Mother   . Hypertension Mother     Social History Social History  Substance Use Topics  . Smoking status: Never Smoker  .  Smokeless tobacco: Never Used  . Alcohol use Yes     Comment: occasionally     Allergies   Amoxicillin   Review of Systems Review of Systems  Constitutional: Positive for appetite change. Negative for chills, fever and unexpected weight change.  HENT: Negative for congestion, ear pain, sore throat, trouble swallowing and voice change.   Respiratory: Negative for cough and shortness of breath.   Cardiovascular: Negative for chest pain and palpitations.  Gastrointestinal: Positive for abdominal pain ( mild generalized "growling", cramping). Negative for diarrhea, nausea and vomiting.  Genitourinary: Positive for menstrual problem ( going through menopause, did have some spotting yesterday and today). Negative for  dysuria, flank pain and hematuria.  Musculoskeletal: Positive for back pain. Negative for arthralgias and myalgias.  Skin: Negative for rash.  All other systems reviewed and are negative.    Physical Exam Triage Vital Signs ED Triage Vitals  Enc Vitals Group     BP      Pulse      Resp      Temp      Temp src      SpO2      Weight      Height      Head Circumference      Peak Flow      Pain Score      Pain Loc      Pain Edu?      Excl. in [place]?    No data found.  Vitals:   02/23/17 1222 02/23/17 1239  BP: (!) 163/99 (!) 154/96  Pulse: (!) 110 87  Temp: 98.5 F (36.9 C)   SpO2: 98%      Updated Vital Signs BP (!) 154/96 (BP Location: Left Arm)   Pulse 87   Temp 98.5 F (36.9 C) (Oral)   Ht 5' 7.5" (1.715 m)   Wt 130 lb (59 kg)   SpO2 98%   BMI 20.06 kg/m      Physical Exam  Constitutional: She is oriented to person, place, and time. She appears well-developed and well-nourished. No distress.  Thin female sitting on exam bed, NAD  HENT:  Head: Normocephalic and atraumatic.  Mouth/Throat: Oropharynx is clear and moist.  Eyes: EOM are normal.  Neck: Normal range of motion.  Cardiovascular: Normal rate and regular rhythm.   Tachycardic in triage, regular rate and rhythm during exam  Pulmonary/Chest: Effort normal and breath sounds normal. No respiratory distress. She has no wheezes. She has no rales.  Abdominal: Soft. She exhibits no distension and no mass. There is no tenderness. There is no rebound, no guarding and no CVA tenderness. No hernia.  Musculoskeletal: Normal range of motion.  Neurological: She is alert and oriented to person, place, and time.  Skin: Skin is warm and dry. She is not diaphoretic.  Psychiatric: She has a normal mood and affect. Her behavior is normal.  Nursing note and vitals reviewed.    UC Treatments / Results  Labs (all labs ordered are listed, but only abnormal results are displayed) Labs Reviewed  POCT CBC W AUTO DIFF  ([place]) - Abnormal; Notable for the following:   POCT URINALYSIS DIP (MANUAL ENTRY) - Abnormal; Notable for the following:       Result Value   Blood, UA moderate (*)    All other components within normal limits  COMPLETE METABOLIC PANEL WITH GFR  TSH    EKG  EKG Interpretation None       Radiology  No results found.  Procedures Procedures (including critical care time)  Medications Ordered in UC Medications - No data to display   Initial Impression / Assessment and Plan / UC Course  I have reviewed the triage vital signs and the nursing notes.  Pertinent labs & imaging results that were available during my care of the patient were reviewed by me and considered in my medical decision making (see chart for details).     Pt c/o vague symptoms of loss of appetite, mild diffuse abdominal cramping and "growling" in her stomach despite making herself eat.  Denies n/v/d.  Per medical hx, has had gradually lost weight from 148 pounds on 03/28/16 to today, 130 pounds.  Pt is thin appearing but non-toxic. BMI still WNL- 20.06 Overall, normal exam.  CBC: WNL CMP and TSH: pending Pt has previously scheduled routine exam with PCP coming up. Encouraged to keep that appointment. Will notify pt of labs when they result, will let pt know if she should f/u sooner if possible.   Discussed symptoms that warrant emergent care in the ED.  Final Clinical Impressions(s) / UC Diagnoses   Final diagnoses:  Acute bilateral low back pain without sciatica  Mid-back pain, acute  Loss of appetite  Abdominal cramping   Home care instructions provided. F/u with PCP sooner if symptoms worsening or if significantly worsening, go to ED for further evaluation and treatment.   New Prescriptions Discharge Medication List as of 02/23/2017 12:23 PM       Controlled Substance Prescriptions [place] Controlled Substance Registry consulted? Not Applicable   Tyrell Antonio 02/23/17  1308

## 2017-02-25 ENCOUNTER — Telehealth: Payer: Self-pay | Admitting: Emergency Medicine

## 2017-02-25 NOTE — Telephone Encounter (Signed)
She is feeling better, labs are normal

## 2017-03-14 ENCOUNTER — Encounter: Payer: Self-pay | Admitting: Obstetrics & Gynecology

## 2017-03-14 ENCOUNTER — Ambulatory Visit (INDEPENDENT_AMBULATORY_CARE_PROVIDER_SITE_OTHER): Payer: BC Managed Care – PPO | Admitting: Obstetrics & Gynecology

## 2017-03-14 VITALS — BP 114/74 | HR 88 | Resp 16 | Ht 67.0 in | Wt 134.0 lb

## 2017-03-14 DIAGNOSIS — Z124 Encounter for screening for malignant neoplasm of cervix: Secondary | ICD-10-CM | POA: Diagnosis not present

## 2017-03-14 DIAGNOSIS — Z1151 Encounter for screening for human papillomavirus (HPV): Secondary | ICD-10-CM

## 2017-03-14 DIAGNOSIS — Z01419 Encounter for gynecological examination (general) (routine) without abnormal findings: Secondary | ICD-10-CM

## 2017-03-14 NOTE — Progress Notes (Signed)
Subjective:    [name] is a 51 y.o. WAA P2 (95 and 83 yo daughters) female who presents for an annual exam. The patient has no complaints today. The patient is not currently sexually active. GYN screening history: last pap: was normal. The patient wears seatbelts: yes. The patient participates in regular exercise: yes. Has the patient ever been transfused or tattooed?: no. The patient reports that there is not domestic violence in her life.   Menstrual History: OB History    Gravida Para Term Preterm AB Living   2 2       2    SAB TAB Ectopic Multiple Live Births                  Menarche age: 57 Patient's last menstrual period was 02/13/2017.    The following portions of the patient's history were reviewed and updated as appropriate: allergies, current medications, past family history, past medical history, past social history, past surgical history and problem list.  Review of Systems Pertinent items are noted in HPI.   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 S/P colonoscopy at 51 yo, due in 10 years Mammogram normal last month    Objective:    BP 114/74   Pulse 88   Resp 16   Ht 5\' 7"  (1.702 m)   Wt 134 lb (60.8 kg)   LMP 02/13/2017   BMI 20.99 kg/m   General Appearance:    Alert, cooperative, no distress, appears stated age  Head:    Normocephalic, without obvious abnormality, atraumatic  Eyes:    PERRL, conjunctiva/corneas clear, EOM's intact, fundi    benign, both eyes  Ears:    Normal TM's and external ear canals, both ears  Nose:   Nares normal, septum midline, mucosa normal, no drainage    or sinus tenderness  Throat:   Lips, mucosa, and tongue normal; teeth and gums normal  Neck:   Supple, symmetrical, trachea midline, no adenopathy;    thyroid:  no enlargement/tenderness/nodules; no carotid   bruit or JVD  Back:     Symmetric, no curvature, ROM normal, no CVA tenderness  Lungs:      Clear to auscultation bilaterally, respirations unlabored  Chest Wall:    No tenderness or deformity   Heart:    Regular rate and rhythm, S1 and S2 normal, no murmur, rub   or gallop  Breast Exam:    No tenderness, masses, or nipple abnormality  Abdomen:     Soft, non-tender, bowel sounds active all four quadrants,    no masses, no organomegaly  Genitalia:    Normal female without lesion, discharge or tenderness, NSSA, NT, mobile, normal adnexal exam     Extremities:   Extremities normal, atraumatic, no cyanosis or edema  Pulses:   2+ and symmetric all extremities  Skin:   Skin color, texture, turgor normal, no rashes or lesions  Lymph nodes:   Cervical, supraclavicular, and axillary nodes normal  Neurologic:   CNII-XII intact, normal strength, sensation and reflexes    throughout  .    Assessment:    Healthy female exam.    Plan:     Thin prep Pap smear. with cotesting

## 2017-03-21 LAB — CYTOLOGY - PAP
DIAGNOSIS: NEGATIVE
HPV: NOT DETECTED

## 2017-03-29 ENCOUNTER — Encounter: Payer: BC Managed Care – PPO | Admitting: Physician Assistant

## 2017-04-05 ENCOUNTER — Encounter: Payer: Self-pay | Admitting: Physician Assistant

## 2017-04-05 ENCOUNTER — Ambulatory Visit (INDEPENDENT_AMBULATORY_CARE_PROVIDER_SITE_OTHER): Payer: BC Managed Care – PPO | Admitting: Physician Assistant

## 2017-04-05 VITALS — BP 135/80 | HR 72 | Ht 67.0 in | Wt 136.0 lb

## 2017-04-05 DIAGNOSIS — R519 Headache, unspecified: Secondary | ICD-10-CM

## 2017-04-05 DIAGNOSIS — R238 Other skin changes: Secondary | ICD-10-CM

## 2017-04-05 DIAGNOSIS — Z23 Encounter for immunization: Secondary | ICD-10-CM

## 2017-04-05 DIAGNOSIS — R233 Spontaneous ecchymoses: Secondary | ICD-10-CM

## 2017-04-05 DIAGNOSIS — R51 Headache: Secondary | ICD-10-CM

## 2017-04-05 DIAGNOSIS — Z131 Encounter for screening for diabetes mellitus: Secondary | ICD-10-CM

## 2017-04-05 DIAGNOSIS — J4530 Mild persistent asthma, uncomplicated: Secondary | ICD-10-CM | POA: Diagnosis not present

## 2017-04-05 DIAGNOSIS — Z13220 Encounter for screening for lipoid disorders: Secondary | ICD-10-CM

## 2017-04-05 DIAGNOSIS — Z0000 Encounter for general adult medical examination without abnormal findings: Secondary | ICD-10-CM

## 2017-04-05 NOTE — Progress Notes (Signed)
Subjective:     [name] is a 51 y.o. female and is here for a comprehensive physical exam. The patient reports problems - see below.   Pt is concerned with easy bruising. She does take ASA every day 81mg . No hx of blood clotting disorder.   For last 3 months she has had a dull headache after episodes of sharp right sided pain only that radiates from behind right ear into right temple. Ibuprofen does help. No aware of any trigger.    Social History   Social History  . Marital status: Married    Spouse name: N/A  . Number of children: N/A  . Years of education: N/A   Occupational History  . 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 Smoking status: Never Smoker  . Smokeless tobacco: Never Used  . Alcohol use Yes     Comment: occasionally  . Drug use: No  . Sexual activity: Not Currently    Partners: Male    Birth control/ protection: Surgical, None   Other Topics Concern  . Not on file   Social History Narrative  . No narrative on file   Health Maintenance  Topic Date Due  . INFLUENZA VACCINE  02/13/2017  . MAMMOGRAM  02/13/2019  . PAP SMEAR  03/14/2020  . TETANUS/TDAP  02/10/2024  . COLONOSCOPY  11/13/2024  . HIV Screening  Completed    The following portions of the patient's history were reviewed and updated as appropriate: allergies, current medications, past family history, past medical history, past social history, past surgical history and problem list.  Review of Systems Pertinent items noted in HPI and remainder of comprehensive ROS otherwise negative.   Objective:    BP 135/80   Pulse 72   Ht 5\' 7"  (1.702 m)   Wt 136 lb (61.7 kg)   BMI 21.30 kg/m  General appearance: alert, cooperative and appears stated age Head: Normocephalic, without obvious abnormality, atraumatic Eyes: conjunctivae/corneas clear. PERRL, EOM's intact. Fundi benign. Ears: normal TM's and external ear canals both ears Nose: Nares normal. Septum  midline. Mucosa normal. No drainage or sinus tenderness. Throat: lips, mucosa, and tongue normal; teeth and gums normal Neck: no adenopathy, no carotid bruit, no JVD, supple, symmetrical, trachea midline and thyroid not enlarged, symmetric, no tenderness/mass/nodules Back: symmetric, no curvature. ROM normal. No CVA tenderness. Lungs: clear to auscultation bilaterally Heart: regular rate and rhythm, S1, S2 normal, no murmur, click, rub or gallop Abdomen: soft, non-tender; bowel sounds normal; no masses,  no organomegaly Extremities: extremities normal, atraumatic, no cyanosis or edema Pulses: 2+ and symmetric Skin: Skin color, texture, turgor normal. No rashes or lesions Lymph nodes: Cervical, supraclavicular, and axillary nodes normal. Neurologic: Alert and oriented X 3, normal strength and tone. Normal symmetric reflexes. Normal coordination and gait    Assessment:    Healthy female exam.      Plan:   Marland KitchenMarland KitchenAdriyana was seen today for annual exam.  Diagnoses and all orders for this visit:  Routine physical examination -     CBC w/Diff/Platelet -     Lipid Panel w/reflex Direct LDL -     COMPLETE METABOLIC PANEL WITH GFR -     Sedimentation rate  Bruises easily -     CBC w/Diff/Platelet  Temporal pain -     CBC w/Diff/Platelet -     Sedimentation rate  Screening for lipid disorders -     Lipid Panel w/reflex Direct LDL  Screening  for diabetes mellitus -     COMPLETE METABOLIC PANEL WITH GFR  Need for immunization against influenza -     Flu Vaccine QUAD 36+ mos IM  Mild persistent chronic asthma without complication -     Pneumococcal polysaccharide vaccine 23-valent greater than or equal to 2yo subcutaneous/IM -     Flu Vaccine QUAD 36+ mos IM  .Marland Kitchen Depression screen [facility] 2/9 04/07/2017 09/18/2016  Decreased Interest 0 1  Down, Depressed, Hopeless 0 0  PHQ - 2 Score 0 1    Pt is high risk for pneumonia due to chronic asthma. Pneumonia and flu shot given.    . Discussed 150 minutes of exercise a week.  Encouraged vitamin D 1000 units and Calcium 1300mg  or 4 servings of dairy a day.   PAP and mammogram up to date.  Colonoscopy up to date.   Will get a CBC to look at platelets due to easy bruising.   No tenderness over temple of right side but she does have pain that radiates there. Will get EST to look for temporal arteritis. Unclear etiology. Could be some tension headaches or even a migraine variant. Keep diary to see if any triggers. Follow up in 4 weeks.    See After Visit Summary for Counseling Recommendations

## 2017-04-05 NOTE — Patient Instructions (Signed)

## 2017-04-07 DIAGNOSIS — R238 Other skin changes: Secondary | ICD-10-CM | POA: Insufficient documentation

## 2017-04-07 DIAGNOSIS — R51 Headache: Secondary | ICD-10-CM

## 2017-04-07 DIAGNOSIS — R233 Spontaneous ecchymoses: Secondary | ICD-10-CM | POA: Insufficient documentation

## 2017-04-07 DIAGNOSIS — R519 Headache, unspecified: Secondary | ICD-10-CM | POA: Insufficient documentation

## 2017-04-08 LAB — COMPLETE METABOLIC PANEL WITH GFR
AG Ratio: 1.4 (calc) (ref 1.0–2.5)
ALT: 9 U/L (ref 6–29)
AST: 14 U/L (ref 10–35)
Albumin: 3.8 g/dL (ref 3.6–5.1)
Alkaline phosphatase (APISO): 47 U/L (ref 33–130)
BILIRUBIN TOTAL: 0.4 mg/dL (ref 0.2–1.2)
BUN: 11 mg/dL (ref 7–25)
CHLORIDE: 107 mmol/L (ref 98–110)
CO2: 28 mmol/L (ref 20–32)
Calcium: 8.9 mg/dL (ref 8.6–10.4)
Creat: 0.78 mg/dL (ref 0.50–1.05)
GFR, EST AFRICAN AMERICAN: 101 mL/min/{1.73_m2} (ref >=60)
GFR, EST NON AFRICAN AMERICAN: 87 mL/min/{1.73_m2} (ref >=60)
GLUCOSE: 89 mg/dL (ref 65–99)
Globulin: 2.7 g/dL (calc) (ref 1.9–3.7)
POTASSIUM: 4.4 mmol/L (ref 3.5–5.3)
SODIUM: 141 mmol/L (ref 135–146)
Total Protein: 6.5 g/dL (ref 6.1–8.1)

## 2017-04-08 LAB — CBC WITH DIFFERENTIAL/PLATELET
BASOS PCT: 1.1 %
Basophils Absolute: 48 cells/uL (ref 0–200)
Eosinophils Absolute: 260 cells/uL (ref 15–500)
Eosinophils Relative: 5.9 %
HEMATOCRIT: 37.6 % (ref 35.0–45.0)
Hemoglobin: 12.1 g/dL (ref 11.7–15.5)
LYMPHS ABS: 2042 {cells}/uL (ref 850–3900)
MCH: 28.2 pg (ref 27.0–33.0)
MCHC: 32.2 g/dL (ref 32.0–36.0)
MCV: 87.6 fL (ref 80.0–100.0)
MPV: 11.6 fL (ref 7.5–12.5)
Monocytes Relative: 10.2 %
Neutro Abs: 1602 cells/uL (ref 1500–7800)
Neutrophils Relative %: 36.4 %
Platelets: 238 10*3/uL (ref 140–400)
RBC: 4.29 10*6/uL (ref 3.80–5.10)
RDW: 12.3 % (ref 11.0–15.0)
Total Lymphocyte: 46.4 %
WBC: 4.4 10*3/uL (ref 3.8–10.8)
WBCMIX: 449 {cells}/uL (ref 200–950)

## 2017-04-08 LAB — LIPID PANEL W/REFLEX DIRECT LDL
CHOL/HDL RATIO: 2.3 (calc) (ref <5.0)
Cholesterol: 201 mg/dL — ABNORMAL HIGH (ref <200)
HDL: 87 mg/dL (ref >50)
LDL CHOLESTEROL (CALC): 101 mg/dL — AB
NON-HDL CHOLESTEROL (CALC): 114 mg/dL (ref <130)
TRIGLYCERIDES: 45 mg/dL (ref <150)

## 2017-04-08 LAB — SEDIMENTATION RATE: Sed Rate: 11 mm/h (ref 0–30)

## 2017-04-16 ENCOUNTER — Other Ambulatory Visit: Payer: Self-pay | Admitting: Physician Assistant

## 2017-06-28 ENCOUNTER — Ambulatory Visit: Payer: BC Managed Care – PPO | Admitting: Physician Assistant

## 2017-06-28 ENCOUNTER — Encounter: Payer: Self-pay | Admitting: Physician Assistant

## 2017-06-28 VITALS — BP 130/69 | HR 75 | Ht 67.0 in | Wt 139.0 lb

## 2017-06-28 DIAGNOSIS — R143 Flatulence: Secondary | ICD-10-CM | POA: Diagnosis not present

## 2017-06-28 MED ORDER — POLYETHYLENE GLYCOL 3350 17 G PO PACK: 17.0000 g | PACK | Freq: Two times a day (BID) | ORAL | 1 refills | Status: DC

## 2017-06-28 NOTE — Progress Notes (Addendum)
Subjective:    Patient ID: [name], female    DOB: 1965-11-17, 51 y.o.   MRN: [id number]  HPI Patient is a 51 y/o female who presents for bad gas.   She states that she starting having worse gas 3 weeks ago and is unsure of a particular trigger. She states that in October she started eating regular yogurt after she had previously been eating "gluten free" yogurt for the last year. After she started experiencing the gas she switched back to her "gluten free" yogurt, but has not had any improvement in her symptoms. She reports that her gas is worse in the mornings, and after having daily cappuccino with cream. She reports that she did not have a cappuccino yesterday and felt less gassy. She denies any abdominal pain or cramping, changes in appetite, bloating, constipation, diarrhea, blood or mucus in her stools, vomiting, nausea, or heartburn. She has not tried taking any OTC medications for relief of symptoms and denies any recent changes in medications. She does admit that some of her stools have been " alittle harder than usual".   .. Active Ambulatory Problems    Diagnosis Date Noted  . Irregular menstrual bleeding 02/27/2011  . Esophageal stricture 12/16/2012  . Anemia 12/22/2012  . Paroxysmal supraventricular tachycardia ([place]) 01/28/2013  . Constipation 09/21/2013  . Allergic rhinitis 09/21/2013  . Asthma, chronic 02/09/2014  . Nasal polyposis 03/25/2014  . Cerumen impaction 03/28/2016  . Left breast mass 06/04/2016  . Teeth grinding 11/16/2016  . Tingling of face 11/16/2016  . Bruises easily 04/07/2017  . Temporal pain 04/07/2017   Resolved Ambulatory Problems    Diagnosis Date Noted  . Asthma with acute exacerbation 03/30/2016   Past Medical History:  Diagnosis Date  . Asthma   . Esophageal stricture 12/16/2012  . Irregular menstrual bleeding 02/27/2011  . Tachycardia      Review of Systems See HPI, all other systems reviewed are negative.    Objective:   Physical Exam  Constitutional: She is oriented to person, place, and time. She appears well-developed and well-nourished.  HENT:  Head: Normocephalic and atraumatic.  Cardiovascular: Normal rate, regular rhythm and normal heart sounds.  Pulmonary/Chest: Effort normal and breath sounds normal.  Abdominal: Soft. Bowel sounds are normal. She exhibits no distension and no mass. There is no tenderness. There is no rebound and no guarding.  Neurological: She is alert and oriented to person, place, and time.  Skin: Skin is warm and dry.  Psychiatric: She has a normal mood and affect. Her behavior is normal. Thought content normal.  Vitals reviewed.     Assessment & Plan:  .Marland KitchenMarland KitchenDiagnoses and all orders for this visit:  Flatulence -     polyethylene glycol (MIRALAX / GLYCOLAX) packet; Take 17 g by mouth 2 (two) times daily. Until stooling regularly   Flatulence - Discussed with patient that her symptoms are likely due to dairy intolerance since they occur with consumption of dairy products. Advised patient to remove all dairy products from her diet and monitor or symptoms. Discussed with patient that she can try taking lactose tablets when she eats dairy products for relief of symptoms if she still wants to consume dairy. Advised patient that she can also take OTC Gas-X for acute relief of symptoms. Advised patient that she can take Miralax daily to soften her stools and clean out her bowels to make sure the cause of her gas is not related to constipation. Encouraged patient to increase her water intake and  make sure she is not straining when trying to have a bowel movement. -follow up as needed. May consider food allergy testing.

## 2017-06-28 NOTE — Patient Instructions (Signed)
Lactose Intolerance, Adult Lactose is the natural sugar found in milk and milk products, such as cheese and yogurt. Lactose is digested by lactase, an enzyme in your small intestine. Some people do not produce enough lactase to digest lactose. This is called lactose intolerance. Lactose intolerance is different from milk allergy, which is a more serious reaction to the protein in milk. What are the causes? Causes of lactose intolerance may include:  Normal aging. The ability to produce lactase may decline with age, causing lactose intolerance over time.  Being born without the ability to make lactase.  Digestive diseases such as gastroenteritis or inflammatory bowel disease.  Surgery or injuries to your small intestine.  Infection in your intestines.  Certain antibiotic medicines and cancer treatments.  What are the signs or symptoms? Lactose intolerance can cause uncomfortable symptoms. These are likely to occur within 30 minutes to 2 hours after eating or drinking foods containing lactose. Symptoms of lactose intolerance may include:  Nausea.  Diarrhea.  Abdominal cramps or pain.  Bloating.  Gas.  How is this diagnosed? There are several tests your health care provider can do to diagnose lactose intolerance. These tests include a hydrogen breath test and stool acidity test. How is this treated? No treatment can improve your body's ability to produce lactase. However, your symptoms can be controlled by limiting or avoiding milk products and other sources of lactose and adjusting your diet. Lactose-free milk is often tolerated. Lactose digestion may also be improved by adding lactase drops to regular milk or by taking lactase tablets when dairy products are consumed. Tolerance to lactose is individual. Some people may be able to eat or drink small amounts of products with lactose, while other may need to avoid lactose entirely. Talk to your health care provider about what is best  for you. Follow these instructions at home:  Limit or avoidfoods, beverages, and medicines containing lactose as directed by your health care provider.  Read food and medicine labels carefully to avoid products containing lactose, milk solids, casein, or whey.  If you eliminate dairy products, replace the protein, calcium, vitamin D, and other nutrients they contain through other foods. A registered dietitian or your health care provider can help you adjust your diet.  Choose a milk substitute that is fortified with calcium and vitamin D. Be aware that soy milk contains high quality protein, while milks made from nuts or grains contain very little protein.  Use lactase drops or tablets if directed by your health care provider. Contact a health care provider if: You have no relief from your symptoms after eliminating milk products and other sources of lactose. This information is not intended to replace advice given to you by your health care provider. Make sure you discuss any questions you have with your health care provider. Document Released: 07/02/2005 Document Revised: 12/08/2015 Document Reviewed: 10/02/2013 Elsevier Interactive Patient Education  2018 [employer] Inc.  

## 2017-06-30 ENCOUNTER — Encounter: Payer: Self-pay | Admitting: Physician Assistant

## 2017-06-30 DIAGNOSIS — R143 Flatulence: Secondary | ICD-10-CM | POA: Insufficient documentation

## 2017-07-14 ENCOUNTER — Other Ambulatory Visit: Payer: Self-pay | Admitting: Physician Assistant

## 2017-07-23 ENCOUNTER — Ambulatory Visit: Payer: BC Managed Care – PPO | Admitting: Family Medicine

## 2017-07-23 ENCOUNTER — Ambulatory Visit (INDEPENDENT_AMBULATORY_CARE_PROVIDER_SITE_OTHER): Payer: BC Managed Care – PPO

## 2017-07-23 VITALS — BP 110/75 | HR 118 | Temp 102.8°F | Ht 67.5 in | Wt 138.0 lb

## 2017-07-23 DIAGNOSIS — J189 Pneumonia, unspecified organism: Secondary | ICD-10-CM

## 2017-07-23 DIAGNOSIS — R05 Cough: Secondary | ICD-10-CM | POA: Diagnosis not present

## 2017-07-23 DIAGNOSIS — R6889 Other general symptoms and signs: Secondary | ICD-10-CM

## 2017-07-23 DIAGNOSIS — R059 Cough, unspecified: Secondary | ICD-10-CM

## 2017-07-23 LAB — POCT INFLUENZA A/B
INFLUENZA A, POC: NEGATIVE
Influenza B, POC: NEGATIVE

## 2017-07-23 MED ORDER — AZITHROMYCIN 250 MG PO TABS: 250.0000 mg | ORAL_TABLET | Freq: Every day | ORAL | 0 refills | Status: DC

## 2017-07-23 MED ORDER — OMEPRAZOLE 40 MG PO CPDR: 40.0000 mg | DELAYED_RELEASE_CAPSULE | Freq: Every day | ORAL | 3 refills | Status: DC

## 2017-07-23 MED ORDER — PREDNISONE 50 MG PO TABS: 50.0000 mg | ORAL_TABLET | Freq: Every day | ORAL | 0 refills | Status: DC

## 2017-07-23 NOTE — Patient Instructions (Signed)
Thank you for coming in today. Get xray today.  Take prednsone and azithromycin.  Use combivent every 6 hours for today and as needed tomorrow.  I will contact you regardless of the results of the xray.  Call or go to the emergency room if you get worse, have trouble breathing, have chest pains, or palpitations.  Recheck if not better.

## 2017-07-23 NOTE — Progress Notes (Signed)
[name] is a 52 y.o. female who presents to [facility] today for concern for flu.   Patient has 1 day history of body aches, fatigue, productive cough, watery eyes. Patient also with chills and subjective fever. Temperature 39.3 today in the office. Patient also with productive cough and significant mucous production. Patient also has asthma, but her breathing has been ok. No increased need for inhalers and she is well stocked with inhalers at home. 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 She does not have contact with small children or the elderly.    Past Medical History:  Diagnosis Date  . Asthma   . Esophageal stricture 12/16/2012   Dilated Spring 2014   . Irregular menstrual bleeding 02/27/2011  . Tachycardia    Past Surgical History:  Procedure Laterality Date  . CESAREAN SECTION     x 2 last in 99  . NASAL POLYP SURGERY    . TUBAL LIGATION  2000   Social History   Tobacco Use  . Smoking status: Never Smoker  . Smokeless tobacco: Never Used  Substance Use Topics  . Alcohol use: Yes    Comment: occasionally   family history includes Diabetes in her mother; Hypertension in her mother.  ROS as above:  Medications: Current Outpatient Medications  Medication Sig Dispense Refill  . BUDESONIDE NA Place into the nose.    . budesonide-formoterol (SYMBICORT) 160-4.5 MCG/ACT inhaler Inhale into the lungs.    . metoprolol succinate (TOPROL-XL) 25 MG 24 hr tablet TAKE 1 TABLET BY MOUTH EVERY DAY 90 tablet 2  . polyethylene glycol (MIRALAX / GLYCOLAX) packet Take 17 g by mouth 2 (two) times daily. Until stooling regularly 30 packet 1  . tiotropium (SPIRIVA) 18 MCG inhalation capsule Place 18 mcg into inhaler and inhale daily.    Marland Kitchen azithromycin (ZITHROMAX) 250 MG tablet Take 1 tablet (250 mg total) by mouth daily. Take first 2 tablets  together, then 1 every day until finished. 6 tablet 0  . omeprazole (PRILOSEC) 40 MG capsule Take 1 capsule (40 mg total) by mouth daily. 30 capsule 3  . predniSONE (DELTASONE) 50 MG tablet Take 1 tablet (50 mg total) by mouth daily. 5 tablet 0   No current facility-administered medications for this visit.    Allergies  Allergen Reactions  . Amoxicillin Other (See Comments)    "It just doesn't work, it sits in my body and doesn't do what it needs to do."    Health Maintenance Health Maintenance  Topic Date Due  . MAMMOGRAM  02/13/2019  . PAP SMEAR  03/14/2020  . TETANUS/TDAP  02/10/2024  . COLONOSCOPY  11/13/2024  . INFLUENZA VACCINE  Completed  . HIV Screening  Completed   Exam:  BP 110/75   Pulse (!) 118   Temp (!) 102.8 F (39.3 C) (Oral)   Ht 5' 7.5" (1.715 m)   Wt 138 lb (62.6 kg)   BMI 21.29 kg/m  Gen: Ill appaering. Sunken, watery eyes. Appears fatigued. HEENT: EOMI,  MMM Lungs: Slightly increased work of breathing. Diffuse coarse rhonchi. No wheezes. No crackles at bases.  Heart: Tachycardia present regular.  No MRG Abd: NABS, Soft. Nondistended, Nontender Exts: Brisk capillary refill, warm and well perfused.    Results for orders placed or performed in visit on 07/23/17 (from the past 72 hour(s))  POCT Influenza A/B     Status: None   Collection Time: 07/23/17  4:36 PM  Result Value Ref Range   Influenza A, POC Negative Negative   Influenza B, POC Negative Negative   No results found.   Assessment and Plan: 52 y.o. female with 1 day nonspecific illness, including fever, sore throat, increased mucous production and cough. Flu test negative however. Pneumonia and bronchitis are certainly possible.  Will order CXR to evaluate for pneumonia. Otherwise patient, especially with asthma, will benefit from steroids and empiric antibiotics for CAP. We will follow up CXR and determine necessity for additional antibiotic coverage if needed.   Otherwise, patient  encouraged to seek out care from on call or ED if worsening, especially given asthma. Patient is well stocked on inhalers at home an encouraged to use rescue inhaler liberally for the next several days.   Of note, patient has diagnosis of "eosinophillic asthma". 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 If patient continues to develop additional nasal polyps or severe sinus symptoms, it may be worthwhile to test.   Orders Placed This Encounter  Procedures  . DG Chest 2 View    Order Specific Question:   Reason for exam:    Answer:   Cough, assess intra-thoracic pathology    Order Specific Question:   Is the patient pregnant?    Answer:   No    Order Specific Question:   Preferred imaging location?    Answer:   [facility]  . POCT Influenza A/B   Meds ordered this encounter  Medications  . predniSONE (DELTASONE) 50 MG tablet    Sig: Take 1 tablet (50 mg total) by mouth daily.    Dispense:  5 tablet    Refill:  0  . azithromycin (ZITHROMAX) 250 MG tablet    Sig: Take 1 tablet (250 mg total) by mouth daily. Take first 2 tablets together, then 1 every day until finished.    Dispense:  6 tablet    Refill:  0  . omeprazole (PRILOSEC) 40 MG capsule    Sig: Take 1 capsule (40 mg total) by mouth daily.    Dispense:  30 capsule    Refill:  3     Discussed warning signs or symptoms. Please see discharge instructions. Patient expresses understanding.

## 2017-07-25 ENCOUNTER — Other Ambulatory Visit: Payer: Self-pay | Admitting: Family Medicine

## 2017-07-25 ENCOUNTER — Telehealth: Payer: Self-pay | Admitting: Family Medicine

## 2017-07-25 MED ORDER — CEFDINIR 300 MG PO CAPS: 300.0000 mg | ORAL_CAPSULE | Freq: Two times a day (BID) | ORAL | 0 refills | Status: DC

## 2017-07-25 NOTE — Telephone Encounter (Signed)
Omnicef sent to pharmacy. See result note for cxr

## 2017-08-14 ENCOUNTER — Encounter: Payer: Self-pay | Admitting: Physician Assistant

## 2017-08-14 ENCOUNTER — Ambulatory Visit (INDEPENDENT_AMBULATORY_CARE_PROVIDER_SITE_OTHER): Payer: BC Managed Care – PPO | Admitting: Physician Assistant

## 2017-08-14 VITALS — BP 130/75 | HR 90 | Ht 67.5 in | Wt 136.0 lb

## 2017-08-14 DIAGNOSIS — J8283 Eosinophilic asthma: Secondary | ICD-10-CM | POA: Insufficient documentation

## 2017-08-14 DIAGNOSIS — J181 Lobar pneumonia, unspecified organism: Secondary | ICD-10-CM

## 2017-08-14 DIAGNOSIS — J4550 Severe persistent asthma, uncomplicated: Secondary | ICD-10-CM

## 2017-08-14 DIAGNOSIS — J82 Pulmonary eosinophilia, not elsewhere classified: Secondary | ICD-10-CM | POA: Diagnosis not present

## 2017-08-14 DIAGNOSIS — J189 Pneumonia, unspecified organism: Secondary | ICD-10-CM

## 2017-08-14 NOTE — Progress Notes (Signed)
   Subjective:    Patient ID: [name], female    DOB: 01-16-1966, 52 y.o.   MRN: [id number]  HPI  Pt is a 52 yo female with hx of chronic para sinusitis, asthma, and recent CAP right lung who presents to the clinic for follow up on CAP.  Pt was first seen on 07/23/17 in our clinic and started on zpak and prednisone. CXR confirmed right lower lobe pneumonia and added omnicef. Pt has finished both and seen pulmonology where she was given depo medrol shot and given levquin for 14 days. She has 1 day left of levaquin. She feels much better. She does still have a intermittent cough with some production. No fever, chills, SOB, chest tightness. She is able to sleep at night. She is working. She is not using combivent regularly.   Pulmonlogy is trying to get Berna Bue approved to help decrease asthma exacerbations.   .. Active Ambulatory Problems    Diagnosis Date Noted  . Irregular menstrual bleeding 02/27/2011  . Esophageal stricture 12/16/2012  . Anemia 12/22/2012  . Paroxysmal supraventricular tachycardia ([place]) 01/28/2013  . Constipation 09/21/2013  . Allergic rhinitis 09/21/2013  . Asthma, chronic 02/09/2014  . Nasal polyposis 03/25/2014  . Cerumen impaction 03/28/2016  . Left breast mass 06/04/2016  . Teeth grinding 11/16/2016  . Tingling of face 11/16/2016  . Bruises easily 04/07/2017  . Temporal pain 04/07/2017  . Flatulence 06/30/2017  . 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 Asthma with acute exacerbation 03/30/2016   Past Medical History:  Diagnosis Date  . Asthma   . Esophageal stricture 12/16/2012  . Irregular menstrual bleeding 02/27/2011  . Tachycardia     Review of Systems  All other systems reviewed and are negative.      Objective:   Physical Exam  Constitutional: She is oriented to person, place, and time. She appears well-developed and well-nourished.  HENT:  Head: Normocephalic and atraumatic.   Eyes: Conjunctivae are normal.  Neck: Normal range of motion. Neck supple.  Cardiovascular: Normal rate, regular rhythm and normal heart sounds.  Pulmonary/Chest: Effort normal.  No wheezing.  Turbulent air sounds bilateral lower lungs.  No rhonchi or crackles.   Lymphadenopathy:    She has no cervical adenopathy.  Neurological: She is alert and oriented to person, place, and time.  Psychiatric: She has a normal mood and affect. Her behavior is normal.          Assessment & Plan:  [name] KitchenMarland KitchenKarron was seen today for cough.  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 Pt appears to be doing much better. Discussed cough can linger with CAP. Consider increasing usuage of combivent while still coughing. If not continuing to improve or worsening CXR ordered that she can get if she needs. Offered one more depo medrol injection;however, after discussing with patient we decided to hold off since she was doing so much better.

## 2017-08-30 ENCOUNTER — Ambulatory Visit (INDEPENDENT_AMBULATORY_CARE_PROVIDER_SITE_OTHER): Payer: BC Managed Care – PPO

## 2017-08-30 DIAGNOSIS — R05 Cough: Secondary | ICD-10-CM | POA: Diagnosis not present

## 2017-08-30 NOTE — Progress Notes (Signed)
Call pt: CXR shows no pneumonia, bronchitis, or abnormality.

## 2017-12-30 ENCOUNTER — Other Ambulatory Visit: Payer: Self-pay | Admitting: Physician Assistant

## 2017-12-30 DIAGNOSIS — Z1231 Encounter for screening mammogram for malignant neoplasm of breast: Secondary | ICD-10-CM

## 2018-02-14 ENCOUNTER — Ambulatory Visit (INDEPENDENT_AMBULATORY_CARE_PROVIDER_SITE_OTHER): Payer: BC Managed Care – PPO

## 2018-02-14 DIAGNOSIS — Z1231 Encounter for screening mammogram for malignant neoplasm of breast: Secondary | ICD-10-CM | POA: Diagnosis not present

## 2018-02-14 NOTE — Progress Notes (Signed)
Call pt: normal mammogram. Follow up in 1 year.

## 2018-03-20 ENCOUNTER — Ambulatory Visit (INDEPENDENT_AMBULATORY_CARE_PROVIDER_SITE_OTHER): Payer: BC Managed Care – PPO | Admitting: Obstetrics & Gynecology

## 2018-03-20 ENCOUNTER — Encounter: Payer: Self-pay | Admitting: Obstetrics & Gynecology

## 2018-03-20 VITALS — BP 122/74 | HR 75 | Resp 16 | Ht 67.0 in | Wt 142.0 lb

## 2018-03-20 DIAGNOSIS — Z01419 Encounter for gynecological examination (general) (routine) without abnormal findings: Secondary | ICD-10-CM

## 2018-03-20 DIAGNOSIS — Z1151 Encounter for screening for human papillomavirus (HPV): Secondary | ICD-10-CM | POA: Diagnosis not present

## 2018-03-20 DIAGNOSIS — Z113 Encounter for screening for infections with a predominantly sexual mode of transmission: Secondary | ICD-10-CM | POA: Diagnosis not present

## 2018-03-20 DIAGNOSIS — Z124 Encounter for screening for malignant neoplasm of cervix: Secondary | ICD-10-CM

## 2018-03-20 NOTE — Progress Notes (Signed)
Subjective:    [name] is a 52 y.o. (17 and 27 yo daughters) female who presents for an annual exam. The patient has no complaints today. The patient is sexually active. GYN screening history: last pap: was normal. The patient wears seatbelts: yes. The patient participates in regular exercise: yes. Has the patient ever been transfused or tattooed?: no. The patient reports that there is not domestic violence in her life.   Menstrual History: OB History    Gravida  2   Para  2   Term      Preterm      AB      Living  2     SAB      TAB      Ectopic      Multiple      Live Births              Menarche age: 64 No LMP recorded. Patient is perimenopausal.    The following portions of the patient's history were reviewed and updated as appropriate: allergies, current medications, past family history, past medical history, past social history, past surgical history and problem list.  Review of Systems Pertinent items are noted in HPI.   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   Objective:    BP 122/74   Pulse 75   Resp 16   Ht 5\' 7"  (1.702 m)   Wt 142 lb (64.4 kg)   BMI 22.24 kg/m   General Appearance:    Alert, cooperative, no distress, appears stated age  Head:    Normocephalic, without obvious abnormality, atraumatic  Eyes:    PERRL, conjunctiva/corneas clear, EOM's intact, fundi    benign, both eyes  Ears:    Normal TM's and external ear canals, both ears  Nose:   Nares normal, septum midline, mucosa normal, no drainage    or sinus tenderness  Throat:   Lips, mucosa, and tongue normal; teeth and gums normal  Neck:   Supple, symmetrical, trachea midline, no adenopathy;    thyroid:  no enlargement/tenderness/nodules; no carotid   bruit or JVD  Back:     Symmetric, no curvature, ROM normal, no CVA tenderness  Lungs:     Clear to auscultation bilaterally,  respirations unlabored  Chest Wall:    No tenderness or deformity   Heart:    Regular rate and rhythm, S1 and S2 normal, no murmur, rub   or gallop  Breast Exam:    No tenderness, masses, or nipple abnormality  Abdomen:     Soft, non-tender, bowel sounds active all four quadrants,    no masses, no organomegaly  Genitalia:    Normal female without lesion, discharge or tenderness, normal size and shape, midplane, mobile, non-tender, normal adnexal exam      Extremities:   Extremities normal, atraumatic, no cyanosis or edema  Pulses:   2+ and symmetric all extremities  Skin:   Skin color, texture, turgor normal, no rashes or lesions  Lymph nodes:   Cervical, supraclavicular, and axillary nodes normal  Neurologic:   CNII-XII intact, normal strength, sensation and reflexes    throughout  .    Assessment:    Healthy female exam.    Plan:     Thin prep Pap smear. with cotesting (aware of ACOG recs) STI testing

## 2018-03-21 LAB — RPR: RPR Ser Ql: NONREACTIVE

## 2018-03-21 LAB — HEPATITIS C ANTIBODY
Hepatitis C Ab: NONREACTIVE
SIGNAL TO CUT-OFF: 0.02 (ref <1.00)

## 2018-03-21 LAB — HIV ANTIBODY (ROUTINE TESTING W REFLEX): HIV 1&2 Ab, 4th Generation: NONREACTIVE

## 2018-03-21 LAB — HEPATITIS B SURFACE ANTIGEN: Hepatitis B Surface Ag: NONREACTIVE

## 2018-03-24 LAB — CYTOLOGY - PAP
Chlamydia: NEGATIVE
Diagnosis: NEGATIVE
HPV (WINDOPATH): NOT DETECTED
NEISSERIA GONORRHEA: NEGATIVE

## 2018-04-01 ENCOUNTER — Other Ambulatory Visit: Payer: Self-pay | Admitting: Physician Assistant

## 2018-04-07 ENCOUNTER — Encounter: Payer: Self-pay | Admitting: Physician Assistant

## 2018-04-07 ENCOUNTER — Ambulatory Visit (INDEPENDENT_AMBULATORY_CARE_PROVIDER_SITE_OTHER): Payer: BC Managed Care – PPO | Admitting: Physician Assistant

## 2018-04-07 VITALS — BP 107/66 | HR 71 | Ht 67.0 in | Wt 141.0 lb

## 2018-04-07 DIAGNOSIS — J82 Pulmonary eosinophilia, not elsewhere classified: Secondary | ICD-10-CM

## 2018-04-07 DIAGNOSIS — Z9189 Other specified personal risk factors, not elsewhere classified: Secondary | ICD-10-CM

## 2018-04-07 DIAGNOSIS — Z0000 Encounter for general adult medical examination without abnormal findings: Secondary | ICD-10-CM

## 2018-04-07 DIAGNOSIS — J8283 Eosinophilic asthma: Secondary | ICD-10-CM

## 2018-04-07 DIAGNOSIS — Z23 Encounter for immunization: Secondary | ICD-10-CM | POA: Diagnosis not present

## 2018-04-07 DIAGNOSIS — Z13220 Encounter for screening for lipoid disorders: Secondary | ICD-10-CM

## 2018-04-07 DIAGNOSIS — Z131 Encounter for screening for diabetes mellitus: Secondary | ICD-10-CM | POA: Diagnosis not present

## 2018-04-07 DIAGNOSIS — R062 Wheezing: Secondary | ICD-10-CM | POA: Diagnosis not present

## 2018-04-07 DIAGNOSIS — Z13820 Encounter for screening for osteoporosis: Secondary | ICD-10-CM | POA: Diagnosis not present

## 2018-04-07 MED ORDER — METHYLPREDNISOLONE SODIUM SUCC 125 MG IJ SOLR
125.0000 mg | Freq: Once | INTRAMUSCULAR | Status: AC
  Administered 2018-04-07: 125 mg via INTRAMUSCULAR

## 2018-04-07 NOTE — Patient Instructions (Addendum)
Health Maintenance for Postmenopausal Women Menopause is a normal process in which your reproductive ability comes to an end. This process happens gradually over a span of months to years, usually between the ages of 22 and 9. Menopause is complete when you have missed 12 consecutive menstrual periods. It is important to talk with your health care provider about some of the most common conditions that affect postmenopausal women, such as heart disease, cancer, and bone loss (osteoporosis). Adopting a healthy lifestyle and getting preventive care can help to promote your health and wellness. Those actions can also lower your chances of developing some of these common conditions. What should I know about menopause? During menopause, you may experience a number of symptoms, such as:  Moderate-to-severe hot flashes.  Night sweats.  Decrease in sex drive.  Mood swings.  Headaches.  Tiredness.  Irritability.  Memory problems.  Insomnia.  Choosing to treat or not to treat menopausal changes is an individual decision that you make with your health care provider. What should I know about hormone replacement therapy and supplements? Hormone therapy products are effective for treating symptoms that are associated with menopause, such as hot flashes and night sweats. Hormone replacement carries certain risks, especially as you become older. If you are thinking about using estrogen or estrogen with progestin treatments, discuss the benefits and risks with your health care provider. What should I know about heart disease and stroke? Heart disease, heart attack, and stroke become more likely as you age. This may be due, in part, to the hormonal changes that your body experiences during menopause. These can affect how your body processes dietary fats, triglycerides, and cholesterol. Heart attack and stroke are both medical emergencies. There are many things that you can do to help prevent heart disease  and stroke:  Have your blood pressure checked at least every 1-2 years. High blood pressure causes heart disease and increases the risk of stroke.  If you are 53-22 years old, ask your health care provider if you should take aspirin to prevent a heart attack or a stroke.  Do not use any tobacco products, including cigarettes, chewing tobacco, or electronic cigarettes. If you need help quitting, ask your health care provider.  It is important to eat a healthy diet and maintain a healthy weight. ? Be sure to include plenty of vegetables, fruits, low-fat dairy products, and lean protein. ? Avoid eating foods that are high in solid fats, added sugars, or salt (sodium).  Get regular exercise. This is one of the most important things that you can do for your health. ? Try to exercise for at least 150 minutes each week. The type of exercise that you do should increase your heart rate and make you sweat. This is known as moderate-intensity exercise. ? Try to do strengthening exercises at least twice each week. Do these in addition to the moderate-intensity exercise.  Know your numbers.Ask your health care provider to check your cholesterol and your blood glucose. Continue to have your blood tested as directed by your health care provider.  What should I know about cancer screening? There are several types of cancer. Take the following steps to reduce your risk and to catch any cancer development as early as possible. Breast Cancer  Practice breast self-awareness. ? This means understanding how your breasts normally appear and feel. ? It also means doing regular breast self-exams. Let your health care provider know about any changes, no matter how small.  If you are 40  or older, have a clinician do a breast exam (clinical breast exam or CBE) every year. Depending on your age, family history, and medical history, it may be recommended that you also have a yearly breast X-ray (mammogram).  If you  have a family history of breast cancer, talk with your health care provider about genetic screening.  If you are at high risk for breast cancer, talk with your health care provider about having an MRI and a mammogram every year.  Breast cancer (BRCA) gene test is recommended for women who have family members with BRCA-related cancers. Results of the assessment will determine the need for genetic counseling and BRCA1 and for BRCA2 testing. BRCA-related cancers include these types: ? Breast. This occurs in males or females. ? Ovarian. ? Tubal. This may also be called fallopian tube cancer. ? Cancer of the abdominal or pelvic lining (peritoneal cancer). ? Prostate. ? Pancreatic.  Cervical, Uterine, and Ovarian Cancer Your health care provider may recommend that you be screened regularly for cancer of the pelvic organs. These include your ovaries, uterus, and vagina. This screening involves a pelvic exam, which includes checking for microscopic changes to the surface of your cervix (Pap test).  For women ages 21-65, health care providers may recommend a pelvic exam and a Pap test every three years. For women ages 79-65, they may recommend the Pap test and pelvic exam, combined with testing for human papilloma virus (HPV), every five years. Some types of HPV increase your risk of cervical cancer. Testing for HPV may also be done on women of any age who have unclear Pap test results.  Other health care providers may not recommend any screening for nonpregnant women who are considered low risk for pelvic cancer and have no symptoms. Ask your health care provider if a screening pelvic exam is right for you.  If you have had past treatment for cervical cancer or a condition that could lead to cancer, you need Pap tests and screening for cancer for at least 20 years after your treatment. If Pap tests have been discontinued for you, your risk factors (such as having a new sexual partner) need to be  reassessed to determine if you should start having screenings again. Some women have medical problems that increase the chance of getting cervical cancer. In these cases, your health care provider may recommend that you have screening and Pap tests more often.  If you have a family history of uterine cancer or ovarian cancer, talk with your health care provider about genetic screening.  If you have vaginal bleeding after reaching menopause, tell your health care provider.  There are currently no reliable tests available to screen for ovarian cancer.  Lung Cancer Lung cancer screening is recommended for adults 69-62 years old who are at high risk for lung cancer because of a history of smoking. A yearly low-dose CT scan of the lungs is recommended if you:  Currently smoke.  Have a history of at least 30 pack-years of smoking and you currently smoke or have quit within the past 15 years. A pack-year is smoking an average of one pack of cigarettes per day for one year.  Yearly screening should:  Continue until it has been 15 years since you quit.  Stop if you develop a health problem that would prevent you from having lung cancer treatment.  Colorectal Cancer  This type of cancer can be detected and can often be prevented.  Routine colorectal cancer screening usually begins at  age 42 and continues through age 45.  If you have risk factors for colon cancer, your health care provider may recommend that you be screened at an earlier age.  If you have a family history of colorectal cancer, talk with your health care provider about genetic screening.  Your health care provider may also recommend using home test kits to check for hidden blood in your stool.  A small camera at the end of a tube can be used to examine your colon directly (sigmoidoscopy or colonoscopy). This is done to check for the earliest forms of colorectal cancer.  Direct examination of the colon should be repeated every  5-10 years until age 71. However, if early forms of precancerous polyps or small growths are found or if you have a family history or genetic risk for colorectal cancer, you may need to be screened more often.  Skin Cancer  Check your skin from head to toe regularly.  Monitor any moles. Be sure to tell your health care provider: ? About any new moles or changes in moles, especially if there is a change in a mole's shape or color. ? If you have a mole that is larger than the size of a pencil eraser.  If any of your family members has a history of skin cancer, especially at a young age, talk with your health care provider about genetic screening.  Always use sunscreen. Apply sunscreen liberally and repeatedly throughout the day.  Whenever you are outside, protect yourself by wearing long sleeves, pants, a wide-brimmed hat, and sunglasses.  What should I know about osteoporosis? Osteoporosis is a condition in which bone destruction happens more quickly than new bone creation. After menopause, you may be at an increased risk for osteoporosis. To help prevent osteoporosis or the bone fractures that can happen because of osteoporosis, the following is recommended:  If you are 46-71 years old, get at least 1,000 mg of calcium and at least 600 mg of vitamin D per day.  If you are older than age 55 but younger than age 65, get at least 1,200 mg of calcium and at least 600 mg of vitamin D per day.  If you are older than age 54, get at least 1,200 mg of calcium and at least 800 mg of vitamin D per day.  Smoking and excessive alcohol intake increase the risk of osteoporosis. Eat foods that are rich in calcium and vitamin D, and do weight-bearing exercises several times each week as directed by your health care provider. What should I know about how menopause affects my mental health? Depression may occur at any age, but it is more common as you become older. Common symptoms of depression  include:  Low or sad mood.  Changes in sleep patterns.  Changes in appetite or eating patterns.  Feeling an overall lack of motivation or enjoyment of activities that you previously enjoyed.  Frequent crying spells.  Talk with your health care provider if you think that you are experiencing depression. What should I know about immunizations? It is important that you get and maintain your immunizations. These include:  Tetanus, diphtheria, and pertussis (Tdap) booster vaccine.  Influenza every year before the flu season begins.  Pneumonia vaccine.  Shingles vaccine.  Your health care provider may also recommend other immunizations. This information is not intended to replace advice given to you by your health care provider. Make sure you discuss any questions you have with your health care provider. Document Released: 08/24/2005  Document Revised: 01/20/2016 Document Reviewed: 04/05/2015 Elsevier Interactive Patient Education  2018 [employer]  

## 2018-04-07 NOTE — Progress Notes (Signed)
107/66 71   141lb 50ft

## 2018-04-08 NOTE — Progress Notes (Signed)
Subjective:     [name] is a 52 y.o. female and is here for a comprehensive physical exam. The patient reports problems - pt has eosinophic asthma. she has been doing great but noticed some wheezing for the last 2 days. she is a little SOB. no fever, chills, or productive cough. using rescue inhaler a little more than had been. pt is managed on spiriva, fascerna, symbicort.   Social History   Socioeconomic History  . Marital status: Married    Spouse name: Not on file  . Number of children: Not on file  . Years of education: Not on file  . Highest education level: Not on file  Occupational History  . Occupation: Librarian, academic of [employer]  . Financial resource strain: Not on file  . Food insecurity:    Worry: Not on file    Inability: Not on file  . Transportation needs:    Medical: Not on file    Non-medical: Not on file  Tobacco Use  . Smoking status: Never Smoker  . Smokeless tobacco: Never Used  Substance and Sexual Activity  . Alcohol use: Yes    Comment: occasionally  . Drug use: No  . Sexual activity: Yes    Partners: Male    Birth control/protection: Surgical, None  Lifestyle  . Physical activity:    Days per week: Not on file    Minutes per session: Not on file  . Stress: Not on file  Relationships  . Social connections:    Talks on phone: Not on file    Gets together: Not on file    Attends religious service: Not on file    Active member of club or organization: Not on file    Attends meetings of clubs or organizations: Not on file    Relationship status: Not on file  . Intimate partner violence:    Fear of current or ex partner: Not on file    Emotionally abused: Not on file    Physically abused: Not on file    Forced sexual activity: Not on file  Other Topics Concern  . Not on file  Social History Narrative  . Not on file   Health Maintenance  Topic Date Due  . MAMMOGRAM  02/15/2020  . PAP SMEAR  03/20/2021  .  TETANUS/TDAP  02/10/2024  . COLONOSCOPY  11/13/2024  . INFLUENZA VACCINE  Completed  . HIV Screening  Completed    The following portions of the patient's history were reviewed and updated as appropriate: allergies, current medications, past family history, past medical history, past social history, past surgical history and problem list.  Review of Systems Pertinent items noted in HPI and remainder of comprehensive ROS otherwise negative.   Objective:    BP 107/66   Pulse 71   Ht 5' 7"  (1.702 m)   Wt 141 lb (64 kg)   BMI 22.08 kg/m  General appearance: alert, cooperative and appears stated age Head: Normocephalic, without obvious abnormality, atraumatic Eyes: conjunctivae/corneas clear. PERRL, EOM's intact. Fundi benign. Ears: normal TM's and external ear canals both ears Nose: Nares normal. Septum midline. Mucosa normal. No drainage or sinus tenderness. Throat: lips, mucosa, and tongue normal; teeth and gums normal Neck: no adenopathy, no carotid bruit, no JVD, supple, symmetrical, trachea midline and thyroid not enlarged, symmetric, no tenderness/mass/nodules Back: symmetric, no curvature. ROM normal. No CVA tenderness. Lungs: wheezes LLL and LUL Heart: regular rate and rhythm, S1, S2 normal, no murmur, click,  rub or gallop Abdomen: soft, non-tender; bowel sounds normal; no masses,  no organomegaly Extremities: extremities normal, atraumatic, no cyanosis or edema Pulses: 2+ and symmetric Skin: Skin color, texture, turgor normal. No rashes or lesions Lymph nodes: Cervical, supraclavicular, and axillary nodes normal. Neurologic: Alert and oriented X 3, normal strength and tone. Normal symmetric reflexes. Normal coordination and gait    Assessment:    Healthy female exam.      Plan:    Marland KitchenMarland KitchenCecily was seen today for annual exam.  Diagnoses and all orders for this visit:  Routine physical examination -     Lipid Panel w/reflex Direct LDL -     COMPLETE METABOLIC PANEL  WITH GFR -     Sed Rate (ESR) -     CBC with Differential/Platelet -     DG Bone Density  Screening for lipid disorders -     Lipid Panel w/reflex Direct LDL  Screening for diabetes mellitus -     COMPLETE METABOLIC PANEL WITH GFR  Osteoporosis screening -     DG Bone Density  At high risk for osteoporosis -     DG Bone Density  Eosinophilic asthma (HCC) -     methylPREDNISolone sodium succinate (SOLU-MEDROL) 125 mg/2 mL injection 125 mg  Need for immunization against influenza -     Flu Vaccine QUAD 36+ mos IM  Wheezing -     methylPREDNISolone sodium succinate (SOLU-MEDROL) 125 mg/2 mL injection 125 mg   .. Depression screen [facility] 2/9 04/07/2018 04/07/2017 09/18/2016  Decreased Interest 0 0 1  Down, Depressed, Hopeless 0 0 0  PHQ - 2 Score 0 0 1  Altered sleeping 0 - -  Tired, decreased energy 0 - -  Change in appetite 0 - -  Feeling bad or failure about yourself  0 - -  Trouble concentrating 0 - -  Moving slowly or fidgety/restless 0 - -  Suicidal thoughts 0 - -  PHQ-9 Score 0 - -  Difficult doing work/chores Not difficult at all - -   .Marland Kitchen Discussed 150 minutes of exercise a week.  Encouraged vitamin D 1000 units and Calcium 1315m or 4 servings of dairy a day.  Fasting labs ordered.  Wheezing heard in left lung. Solumedrol given in office today. Hopefully this will abort any exacerbation. Continue rescue as needed. Follow up with any signs of infection such as productive sputum.  Bone density ordered due to high risk with prednisone use.  Mammogram up to date.  Colonoscopy up to date.  Flu shot given.  See After Visit Summary for Counseling Recommendations

## 2018-04-09 ENCOUNTER — Telehealth: Payer: Self-pay

## 2018-04-09 ENCOUNTER — Ambulatory Visit (INDEPENDENT_AMBULATORY_CARE_PROVIDER_SITE_OTHER): Payer: BC Managed Care – PPO

## 2018-04-09 ENCOUNTER — Other Ambulatory Visit: Payer: Self-pay | Admitting: Physician Assistant

## 2018-04-09 DIAGNOSIS — Z13820 Encounter for screening for osteoporosis: Secondary | ICD-10-CM

## 2018-04-09 NOTE — Telephone Encounter (Signed)
[name] called this morning and wanted to let you know that she is taking a Vitamin D supplementation daily (1057mcg). She said you wanted her to call back and let you know! Thanks!

## 2018-04-09 NOTE — Telephone Encounter (Signed)
Done

## 2018-04-09 NOTE — Telephone Encounter (Signed)
Awesome perfect did you let her know about bone density report.

## 2018-04-09 NOTE — Progress Notes (Signed)
Call pt: normal bone density. Still recommend vitamin D 1000 units and calcium 4 servings or 1300mg  daily.

## 2018-04-10 LAB — CBC WITH DIFFERENTIAL/PLATELET
BASOS ABS: 11 {cells}/uL (ref 0–200)
BASOS PCT: 0.2 %
EOS ABS: 0 {cells}/uL — AB (ref 15–500)
Eosinophils Relative: 0 %
HCT: 39.1 % (ref 35.0–45.0)
Hemoglobin: 12.5 g/dL (ref 11.7–15.5)
Lymphs Abs: 2632 cells/uL (ref 850–3900)
MCH: 28.2 pg (ref 27.0–33.0)
MCHC: 32 g/dL (ref 32.0–36.0)
MCV: 88.3 fL (ref 80.0–100.0)
MONOS PCT: 7.2 %
MPV: 11.4 fL (ref 7.5–12.5)
NEUTROS ABS: 2554 {cells}/uL (ref 1500–7800)
Neutrophils Relative %: 45.6 %
PLATELETS: 262 10*3/uL (ref 140–400)
RBC: 4.43 10*6/uL (ref 3.80–5.10)
RDW: 12 % (ref 11.0–15.0)
Total Lymphocyte: 47 %
WBC mixed population: 403 cells/uL (ref 200–950)
WBC: 5.6 10*3/uL (ref 3.8–10.8)

## 2018-04-10 LAB — COMPLETE METABOLIC PANEL WITH GFR
AG RATIO: 1.4 (calc) (ref 1.0–2.5)
ALT: 12 U/L (ref 6–29)
AST: 17 U/L (ref 10–35)
Albumin: 3.7 g/dL (ref 3.6–5.1)
Alkaline phosphatase (APISO): 44 U/L (ref 33–130)
BUN: 13 mg/dL (ref 7–25)
CALCIUM: 9.5 mg/dL (ref 8.6–10.4)
CO2: 30 mmol/L (ref 20–32)
Chloride: 106 mmol/L (ref 98–110)
Creat: 0.81 mg/dL (ref 0.50–1.05)
GFR, Est African American: 96 mL/min/{1.73_m2} (ref >=60)
GFR, Est Non African American: 83 mL/min/{1.73_m2} (ref >=60)
Globulin: 2.6 g/dL (calc) (ref 1.9–3.7)
Glucose, Bld: 97 mg/dL (ref 65–99)
POTASSIUM: 5.1 mmol/L (ref 3.5–5.3)
Sodium: 143 mmol/L (ref 135–146)
Total Bilirubin: 0.4 mg/dL (ref 0.2–1.2)
Total Protein: 6.3 g/dL (ref 6.1–8.1)

## 2018-04-10 LAB — LIPID PANEL W/REFLEX DIRECT LDL
CHOL/HDL RATIO: 2.5 (calc) (ref <5.0)
Cholesterol: 204 mg/dL — ABNORMAL HIGH (ref <200)
HDL: 83 mg/dL (ref >50)
LDL CHOLESTEROL (CALC): 107 mg/dL — AB
NON-HDL CHOLESTEROL (CALC): 121 mg/dL (ref <130)
Triglycerides: 59 mg/dL (ref <150)

## 2018-04-10 LAB — SEDIMENTATION RATE: Sed Rate: 6 mm/h (ref 0–30)

## 2018-04-10 NOTE — Progress Notes (Signed)
Call pt: cholesterol looks really good. HDL is 83. Kidney, liver, glucose looks great!  Eosinophil count is 0!!!! GREAT news.   Wonderful labs.

## 2018-08-28 ENCOUNTER — Ambulatory Visit: Payer: BC Managed Care – PPO | Admitting: Obstetrics & Gynecology

## 2018-08-28 ENCOUNTER — Encounter: Payer: Self-pay | Admitting: Obstetrics & Gynecology

## 2018-08-28 VITALS — BP 121/80 | HR 75 | Ht 67.0 in | Wt 142.0 lb

## 2018-08-28 DIAGNOSIS — N938 Other specified abnormal uterine and vaginal bleeding: Secondary | ICD-10-CM | POA: Diagnosis not present

## 2018-08-28 NOTE — Progress Notes (Signed)
   Subjective:    Patient ID: [name], female    DOB: 07/28/1965, 53 y.o.   MRN: [id number]  HPI 53 yo P2 here with DUB this past month with heavy big clots. She skipped her period from 3/10 to 10/19 and then had a regular monthly period until about 2 weeks ago. She has bled constantly since then. Nothing makes it better or worse.   Review of Systems     Objective:   Physical Exam Breathing, conversing, and ambulating normally Well nourished, well hydrated Black female, no apparent distress Abd- benign Speculum exam reveals some old blood in the vault, normal appearing cervix     Assessment & Plan:  Perimenopausal DUB- check TSH, CBC and gyn u/s

## 2018-08-28 NOTE — Progress Notes (Signed)
Pt c/o heavy bleeding

## 2018-08-29 ENCOUNTER — Ambulatory Visit (INDEPENDENT_AMBULATORY_CARE_PROVIDER_SITE_OTHER): Payer: BC Managed Care – PPO

## 2018-08-29 DIAGNOSIS — N83291 Other ovarian cyst, right side: Secondary | ICD-10-CM | POA: Diagnosis not present

## 2018-08-29 DIAGNOSIS — N938 Other specified abnormal uterine and vaginal bleeding: Secondary | ICD-10-CM

## 2018-08-29 LAB — CBC
HEMATOCRIT: 38.1 % (ref 35.0–45.0)
HEMOGLOBIN: 12.6 g/dL (ref 11.7–15.5)
MCH: 29.7 pg (ref 27.0–33.0)
MCHC: 33.1 g/dL (ref 32.0–36.0)
MCV: 89.9 fL (ref 80.0–100.0)
MPV: 11.3 fL (ref 7.5–12.5)
Platelets: 309 10*3/uL (ref 140–400)
RBC: 4.24 10*6/uL (ref 3.80–5.10)
RDW: 12.1 % (ref 11.0–15.0)
WBC: 6.3 10*3/uL (ref 3.8–10.8)

## 2018-08-29 LAB — TSH: TSH: 0.99 mIU/L

## 2018-09-01 ENCOUNTER — Other Ambulatory Visit: Payer: Self-pay | Admitting: Obstetrics & Gynecology

## 2018-09-01 MED ORDER — MISOPROSTOL 200 MCG PO TABS: ORAL_TABLET | ORAL | 0 refills | Status: DC

## 2018-09-01 NOTE — Progress Notes (Signed)
cytotec prescribed for the night before the endometrial biopsy

## 2018-09-08 ENCOUNTER — Ambulatory Visit: Payer: BC Managed Care – PPO | Admitting: Obstetrics & Gynecology

## 2018-09-08 ENCOUNTER — Encounter: Payer: Self-pay | Admitting: Obstetrics & Gynecology

## 2018-09-08 VITALS — BP 119/89 | HR 81 | Ht 67.0 in | Wt 144.0 lb

## 2018-09-08 DIAGNOSIS — N950 Postmenopausal bleeding: Secondary | ICD-10-CM | POA: Diagnosis not present

## 2018-09-08 DIAGNOSIS — Z3202 Encounter for pregnancy test, result negative: Secondary | ICD-10-CM | POA: Diagnosis not present

## 2018-09-08 DIAGNOSIS — Z01812 Encounter for preprocedural laboratory examination: Secondary | ICD-10-CM

## 2018-09-08 LAB — POCT URINE PREGNANCY: Preg Test, Ur: NEGATIVE

## 2018-09-08 NOTE — Progress Notes (Signed)
   Subjective:    Patient ID: [name], female    DOB: February 22, 1966, 53 y.o.   MRN: [id number]  HPI 53 yo P2 here for [facility] for PMB. Her endometrium was 5 mm on a recent gyn u/s. Adenomyosis was seen as well. She took cytotec last night and has had some diarrhea today.   Review of Systems     Objective:   Physical Exam Breathing, conversing, and ambulating normally Well nourished, well hydrated Black female, no apparent distress  UPT negative, consent signed, time out done Cervix prepped with betadine and grasped with a single tooth tenaculum Uterus sounded to 9 cm Pipelle used for 3 passes with the first 2 containing a lot of old blood The third has fresher blood and what appeared to be some tissue. She tolerated the procedure well.     Assessment & Plan:  PMB- await pathology

## 2018-09-29 ENCOUNTER — Ambulatory Visit: Payer: BC Managed Care – PPO | Admitting: Obstetrics & Gynecology

## 2018-12-30 ENCOUNTER — Ambulatory Visit: Payer: BC Managed Care – PPO | Admitting: Physician Assistant

## 2019-01-13 ENCOUNTER — Other Ambulatory Visit: Payer: Self-pay | Admitting: Physician Assistant

## 2019-01-19 ENCOUNTER — Other Ambulatory Visit: Payer: Self-pay | Admitting: Physician Assistant

## 2019-01-19 DIAGNOSIS — Z1231 Encounter for screening mammogram for malignant neoplasm of breast: Secondary | ICD-10-CM

## 2019-02-11 ENCOUNTER — Other Ambulatory Visit: Payer: Self-pay | Admitting: Physician Assistant

## 2019-02-19 ENCOUNTER — Other Ambulatory Visit: Payer: Self-pay

## 2019-02-19 ENCOUNTER — Other Ambulatory Visit: Payer: Self-pay | Admitting: Physician Assistant

## 2019-02-19 ENCOUNTER — Ambulatory Visit (INDEPENDENT_AMBULATORY_CARE_PROVIDER_SITE_OTHER): Payer: BC Managed Care – PPO

## 2019-02-19 DIAGNOSIS — Z1231 Encounter for screening mammogram for malignant neoplasm of breast: Secondary | ICD-10-CM | POA: Diagnosis not present

## 2019-02-20 NOTE — Progress Notes (Signed)
Normal mammogram. Follow up in 1 year.

## 2019-02-23 ENCOUNTER — Ambulatory Visit: Payer: BC Managed Care – PPO | Admitting: Physician Assistant

## 2019-03-05 ENCOUNTER — Other Ambulatory Visit: Payer: Self-pay | Admitting: Neurology

## 2019-03-05 MED ORDER — METOPROLOL SUCCINATE ER 25 MG PO TB24: 25.0000 mg | ORAL_TABLET | Freq: Every day | ORAL | 1 refills | Status: DC

## 2019-04-08 ENCOUNTER — Other Ambulatory Visit: Payer: Self-pay | Admitting: Physician Assistant

## 2019-04-09 ENCOUNTER — Ambulatory Visit (INDEPENDENT_AMBULATORY_CARE_PROVIDER_SITE_OTHER): Payer: BC Managed Care – PPO | Admitting: Physician Assistant

## 2019-04-09 ENCOUNTER — Other Ambulatory Visit: Payer: Self-pay

## 2019-04-09 ENCOUNTER — Encounter: Payer: Self-pay | Admitting: Physician Assistant

## 2019-04-09 VITALS — BP 124/88 | HR 70 | Temp 98.6°F | Ht 68.0 in | Wt 143.0 lb

## 2019-04-09 DIAGNOSIS — I471 Supraventricular tachycardia: Secondary | ICD-10-CM

## 2019-04-09 DIAGNOSIS — Z131 Encounter for screening for diabetes mellitus: Secondary | ICD-10-CM

## 2019-04-09 DIAGNOSIS — Z13220 Encounter for screening for lipoid disorders: Secondary | ICD-10-CM | POA: Diagnosis not present

## 2019-04-09 DIAGNOSIS — Z0000 Encounter for general adult medical examination without abnormal findings: Secondary | ICD-10-CM

## 2019-04-09 DIAGNOSIS — Z23 Encounter for immunization: Secondary | ICD-10-CM | POA: Diagnosis not present

## 2019-04-09 MED ORDER — METOPROLOL SUCCINATE ER 25 MG PO TB24: 25.0000 mg | ORAL_TABLET | Freq: Every day | ORAL | 3 refills | Status: DC

## 2019-04-09 NOTE — Progress Notes (Addendum)
Subjective:    Patient ID: [name], female    DOB: September 17, 1965, 53 y.o.   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 She has no complaints and has been doing well. She is on [place] and says that it feels life-changing. She walks 4 miles around her neighborhood 5 days each week. She eats lots of fruits and vegetables, admits to snacking on processed foods often. She goes to the dentist regularly. She does not go to the eye doctor.   .. Active Ambulatory Problems    Diagnosis Date Noted  . Irregular menstrual bleeding 02/27/2011  . Esophageal stricture 12/16/2012  . Anemia 12/22/2012  . Paroxysmal supraventricular tachycardia ([place]) 01/28/2013  . Constipation 09/21/2013  . Allergic rhinitis 09/21/2013  . Asthma, chronic 02/09/2014  . Nasal polyposis 03/25/2014  . Cerumen impaction 03/28/2016  . Left breast mass 06/04/2016  . Teeth grinding 11/16/2016  . Tingling of face 11/16/2016  . Bruises easily 04/07/2017  . Temporal pain 04/07/2017  . Flatulence 06/30/2017  . Eosinophilic asthma ([place]) [id number]   Resolved Ambulatory Problems    Diagnosis Date Noted  . Asthma with acute exacerbation 03/30/2016  . Pneumonia of right lower lobe due to infectious organism [facility]) 08/14/2017   Past Medical History:  Diagnosis Date  . Asthma   . Tachycardia    .Marland Kitchen Family History  Problem Relation Age of Onset  . Diabetes Mother   . Hypertension Mother    .Marland Kitchen Social History   Socioeconomic History  . Marital status: Married    Spouse name: Not on file  . Number of children: Not on file  . Years of education: Not on file  . Highest education level: Not on file  Occupational History  . Occupation: Librarian, academic of [employer]  . Financial resource strain: Not on file  . Food insecurity    Worry: Not on file    Inability: Not on file  . Transportation needs    Medical: Not on  file    Non-medical: Not on file  Tobacco Use  . Smoking status: Never Smoker  . Smokeless tobacco: Never Used  Substance and Sexual Activity  . Alcohol use: Yes    Comment: occasionally  . Drug use: No  . Sexual activity: Yes    Partners: Male    Birth control/protection: Surgical, None  Lifestyle  . Physical activity    Days per week: Not on file    Minutes per session: Not on file  . Stress: Not on file  Relationships  . Social Herbalist on phone: Not on file    Gets together: Not on file    Attends religious service: Not on file    Active member of club or organization: Not on file    Attends meetings of clubs or organizations: Not on file    Relationship status: Not on file  . Intimate partner violence    Fear of current or ex partner: Not on file    Emotionally abused: Not on file    Physically abused: Not on file    Forced sexual activity: Not on file  Other Topics Concern  . Not on file  Social History Narrative  . Not on file      Review of Systems  Constitutional: Negative for fatigue and unexpected weight change.  HENT: Negative for hearing loss and tinnitus.  Eyes: Negative.   Respiratory: Negative for shortness of breath and wheezing.   Cardiovascular: Negative for chest pain and palpitations.  Gastrointestinal: Negative for abdominal pain, constipation and diarrhea.  Genitourinary: Negative.   Neurological: Negative for headaches.  Psychiatric/Behavioral: Negative for dysphoric mood. The patient is not nervous/anxious.        Objective:   Physical Exam Constitutional:      General: She is not in acute distress.    Appearance: Normal appearance.  HENT:     Head: Normocephalic and atraumatic.     Right Ear: Tympanic membrane and ear canal normal.     Left Ear: Tympanic membrane and ear canal normal.     Nose: Nose normal.     Mouth/Throat:     Mouth: Mucous membranes are moist.     Pharynx: Oropharynx is clear.  Eyes:      Extraocular Movements: Extraocular movements intact.     Conjunctiva/sclera: Conjunctivae normal.     Pupils: Pupils are equal, round, and reactive to light.  Neck:     Musculoskeletal: Neck supple. No muscular tenderness.  Cardiovascular:     Rate and Rhythm: Normal rate and regular rhythm.     Heart sounds: Normal heart sounds.  Pulmonary:     Effort: Pulmonary effort is normal.     Breath sounds: Normal breath sounds. No wheezing.  Abdominal:     General: Bowel sounds are normal.     Palpations: Abdomen is soft. There is no mass.     Tenderness: There is no abdominal tenderness.  Musculoskeletal:        General: Swelling (slight peripheral edema) present.  Lymphadenopathy:     Cervical: No cervical adenopathy.  Skin:    General: Skin is warm and dry.  Neurological:     Mental Status: She is alert and oriented to person, place, and time.     Deep Tendon Reflexes: Reflexes normal.  Psychiatric:        Mood and Affect: Mood normal.        Behavior: Behavior normal.    .. Today's Vitals   04/09/19 1547  BP: 124/88  Pulse: 70  Temp: 98.6 F (37 C)  TempSrc: Oral  Weight: 143 lb (64.9 kg)  Height: 5\' 8"  (1.727 m)   Body mass index is 21.74 kg/m.  ..   Office Visit from 04/09/2019 in [place]  PHQ-9 Total Score  0     .. GAD 7 : Generalized Anxiety Score 04/09/2019 04/07/2018  Nervous, Anxious, on Edge 0 0  Control/stop worrying 0 0  Worry too much - different things 0 0  Trouble relaxing 0 0  Restless 0 0  Easily annoyed or irritable 0 0  Afraid - awful might happen 0 0  Total GAD 7 Score 0 0  Anxiety Difficulty Not difficult at all Not difficult at all       Assessment & Plan:   Marland KitchenMarland KitchenDiagnoses and all orders for this visit:  Routine physical examination -     Lipid Panel w/reflex Direct LDL -     COMPLETE METABOLIC PANEL WITH GFR -     CBC with Differential/Platelet  Paroxysmal supraventricular tachycardia (HCC) -      metoprolol succinate (TOPROL-XL) 25 MG 24 hr tablet; Take 1 tablet (25 mg total) by mouth daily.  Screening for lipid disorders -     Lipid Panel w/reflex Direct LDL  Screening for diabetes mellitus -     COMPLETE  METABOLIC PANEL WITH GFR  Need for shingles vaccine -     Varicella-zoster vaccine IM (Shingrix)   Declined flu shot.  Received first dose of Shingrex in office today.  Colonoscopy, mammogram, pap smear, DEXA scan, pap all UTD.  Pt is on multivitamin - encouraged her to take 1000mg  Vitamin D and 1300mg  Calcium.  TSH evaluated in February, was WNL. Will recheck lipids, CBC, and CMP.  Medication refills sent.   Marland KitchenVernetta Honey PA-C, have reviewed and agree with the above documentation in it's entirety.

## 2019-04-09 NOTE — Patient Instructions (Signed)
Health Maintenance, Female Adopting a healthy lifestyle and getting preventive care are important in promoting health and wellness. Ask your health care provider about:  The right schedule for you to have regular tests and exams.  Things you can do on your own to prevent diseases and keep yourself healthy. What should I know about diet, weight, and exercise? Eat a healthy diet   Eat a diet that includes plenty of vegetables, fruits, low-fat dairy products, and lean protein.  Do not eat a lot of foods that are high in solid fats, added sugars, or sodium. Maintain a healthy weight Body mass index (BMI) is used to identify weight problems. It estimates body fat based on height and weight. Your health care provider can help determine your BMI and help you achieve or maintain a healthy weight. Get regular exercise Get regular exercise. This is one of the most important things you can do for your health. Most adults should:  Exercise for at least 150 minutes each week. The exercise should increase your heart rate and make you sweat (moderate-intensity exercise).  Do strengthening exercises at least twice a week. This is in addition to the moderate-intensity exercise.  Spend less time sitting. Even light physical activity can be beneficial. Watch cholesterol and blood lipids Have your blood tested for lipids and cholesterol at 54 years of age, then have this test every 5 years. Have your cholesterol levels checked more often if:  Your lipid or cholesterol levels are high.  You are older than 54 years of age.  You are at high risk for heart disease. What should I know about cancer screening? Depending on your health history and family history, you may need to have cancer screening at various ages. This may include screening for:  Breast cancer.  Cervical cancer.  Colorectal cancer.  Skin cancer.  Lung cancer. What should I know about heart disease, diabetes, and high blood  pressure? Blood pressure and heart disease  High blood pressure causes heart disease and increases the risk of stroke. This is more likely to develop in people who have high blood pressure readings, are of African descent, or are overweight.  Have your blood pressure checked: ? Every 3-5 years if you are 18-39 years of age. ? Every year if you are 40 years old or older. Diabetes Have regular diabetes screenings. This checks your fasting blood sugar level. Have the screening done:  Once every three years after age 40 if you are at a normal weight and have a low risk for diabetes.  More often and at a younger age if you are overweight or have a high risk for diabetes. What should I know about preventing infection? Hepatitis B If you have a higher risk for hepatitis B, you should be screened for this virus. Talk with your health care provider to find out if you are at risk for hepatitis B infection. Hepatitis C Testing is recommended for:  Everyone born from 1945 through 1965.  Anyone with known risk factors for hepatitis C. Sexually transmitted infections (STIs)  Get screened for STIs, including gonorrhea and chlamydia, if: ? You are sexually active and are younger than 54 years of age. ? You are older than 54 years of age and your health care provider tells you that you are at risk for this type of infection. ? Your sexual activity has changed since you were last screened, and you are at increased risk for chlamydia or gonorrhea. Ask your health care provider if   you are at risk.  Ask your health care provider about whether you are at high risk for HIV. Your health care provider may recommend a prescription medicine to help prevent HIV infection. If you choose to take medicine to prevent HIV, you should first get tested for HIV. You should then be tested every 3 months for as long as you are taking the medicine. Pregnancy  If you are about to stop having your period (premenopausal) and  you may become pregnant, seek counseling before you get pregnant.  Take 400 to 800 micrograms (mcg) of folic acid every day if you become pregnant.  Ask for birth control (contraception) if you want to prevent pregnancy. Osteoporosis and menopause Osteoporosis is a disease in which the bones lose minerals and strength with aging. This can result in bone fractures. If you are 65 years old or older, or if you are at risk for osteoporosis and fractures, ask your health care provider if you should:  Be screened for bone loss.  Take a calcium or vitamin D supplement to lower your risk of fractures.  Be given hormone replacement therapy (HRT) to treat symptoms of menopause. Follow these instructions at home: Lifestyle  Do not use any products that contain nicotine or tobacco, such as cigarettes, e-cigarettes, and chewing tobacco. If you need help quitting, ask your health care provider.  Do not use street drugs.  Do not share needles.  Ask your health care provider for help if you need support or information about quitting drugs. Alcohol use  Do not drink alcohol if: ? Your health care provider tells you not to drink. ? You are pregnant, may be pregnant, or are planning to become pregnant.  If you drink alcohol: ? Limit how much you use to 0-1 drink a day. ? Limit intake if you are breastfeeding.  Be aware of how much alcohol is in your drink. In the [place] one drink equals one 12 oz bottle of beer (355 mL), one 5 oz glass of wine (148 mL), or one 1 oz glass of hard liquor (44 mL). General instructions  Schedule regular health, dental, and eye exams.  Stay current with your vaccines.  Tell your health care provider if: ? You often feel depressed. ? You have ever been abused or do not feel safe at home. Summary  Adopting a healthy lifestyle and getting preventive care are important in promoting health and wellness.  Follow your health care provider's instructions about healthy  diet, exercising, and getting tested or screened for diseases.  Follow your health care provider's instructions on monitoring your cholesterol and blood pressure. This information is not intended to replace advice given to you by your health care provider. Make sure you discuss any questions you have with your health care provider. Document Released: 01/15/2011 Document Revised: 06/25/2018 Document Reviewed: 06/25/2018 Elsevier Patient Education  2020 Elsevier Inc.  

## 2019-04-10 ENCOUNTER — Encounter: Payer: BC Managed Care – PPO | Admitting: Physician Assistant

## 2019-04-11 LAB — CBC WITH DIFFERENTIAL/PLATELET
Absolute Monocytes: 426 cells/uL (ref 200–950)
Basophils Absolute: 10 cells/uL (ref 0–200)
Basophils Relative: 0.2 %
Eosinophils Absolute: 0 cells/uL — ABNORMAL LOW (ref 15–500)
Eosinophils Relative: 0 %
HCT: 43.3 % (ref 35.0–45.0)
Hemoglobin: 13.9 g/dL (ref 11.7–15.5)
Lymphs Abs: 1534 cells/uL (ref 850–3900)
MCH: 28.2 pg (ref 27.0–33.0)
MCHC: 32.1 g/dL (ref 32.0–36.0)
MCV: 87.8 fL (ref 80.0–100.0)
MPV: 11.6 fL (ref 7.5–12.5)
Monocytes Relative: 8.2 %
Neutro Abs: 3229 cells/uL (ref 1500–7800)
Neutrophils Relative %: 62.1 %
Platelets: 259 10*3/uL (ref 140–400)
RBC: 4.93 10*6/uL (ref 3.80–5.10)
RDW: 12.4 % (ref 11.0–15.0)
Total Lymphocyte: 29.5 %
WBC: 5.2 10*3/uL (ref 3.8–10.8)

## 2019-04-11 LAB — LIPID PANEL W/REFLEX DIRECT LDL
Cholesterol: 234 mg/dL — ABNORMAL HIGH (ref <200)
HDL: 87 mg/dL (ref >=50)
LDL Cholesterol (Calc): 131 mg/dL (calc) — ABNORMAL HIGH
Non-HDL Cholesterol (Calc): 147 mg/dL (calc) — ABNORMAL HIGH (ref <130)
Total CHOL/HDL Ratio: 2.7 (calc) (ref <5.0)
Triglycerides: 67 mg/dL (ref <150)

## 2019-04-11 LAB — COMPLETE METABOLIC PANEL WITH GFR
AG Ratio: 1.5 (calc) (ref 1.0–2.5)
ALT: 18 U/L (ref 6–29)
AST: 21 U/L (ref 10–35)
Albumin: 4.2 g/dL (ref 3.6–5.1)
Alkaline phosphatase (APISO): 52 U/L (ref 37–153)
BUN: 15 mg/dL (ref 7–25)
CO2: 26 mmol/L (ref 20–32)
Calcium: 9.7 mg/dL (ref 8.6–10.4)
Chloride: 102 mmol/L (ref 98–110)
Creat: 0.75 mg/dL (ref 0.50–1.05)
GFR, Est African American: 105 mL/min/{1.73_m2} (ref >=60)
GFR, Est Non African American: 90 mL/min/{1.73_m2} (ref >=60)
Globulin: 2.8 g/dL (calc) (ref 1.9–3.7)
Glucose, Bld: 89 mg/dL (ref 65–99)
Potassium: 4 mmol/L (ref 3.5–5.3)
Sodium: 140 mmol/L (ref 135–146)
Total Bilirubin: 0.6 mg/dL (ref 0.2–1.2)
Total Protein: 7 g/dL (ref 6.1–8.1)

## 2019-04-13 ENCOUNTER — Encounter: Payer: Self-pay | Admitting: Physician Assistant

## 2019-04-13 DIAGNOSIS — E785 Hyperlipidemia, unspecified: Secondary | ICD-10-CM | POA: Insufficient documentation

## 2019-04-13 NOTE — Progress Notes (Signed)
Keyetta,   Your labs look really good. CBC perfect. Kidney/liver/glucose fantastic. LDL did go up but with HDL at 87 your CV risk is still 1.7 percent over 10 years. AHA does not recommend a statin until 7.5 percent.   -Luvenia Starch

## 2019-06-15 ENCOUNTER — Other Ambulatory Visit: Payer: Self-pay

## 2019-06-15 ENCOUNTER — Ambulatory Visit (INDEPENDENT_AMBULATORY_CARE_PROVIDER_SITE_OTHER): Payer: BC Managed Care – PPO | Admitting: Physician Assistant

## 2019-06-15 VITALS — BP 119/74 | HR 98 | Temp 98.2°F | Wt 143.0 lb

## 2019-06-15 DIAGNOSIS — Z23 Encounter for immunization: Secondary | ICD-10-CM | POA: Diagnosis not present

## 2019-06-15 NOTE — Progress Notes (Signed)
Pt in today for shingrix vaccine. This is 2 of 2 of the shingrix series. Vitals taken and no fever noted. Vaccine was given in left deltoid. Pt tolerated well with no immediate complications.

## 2019-06-19 ENCOUNTER — Encounter: Payer: Self-pay | Admitting: Neurology

## 2019-06-19 ENCOUNTER — Telehealth: Payer: Self-pay

## 2019-06-19 NOTE — Telephone Encounter (Signed)
Opened in error

## 2019-06-19 NOTE — Telephone Encounter (Signed)
This encounter was created in error - please disregard.

## 2019-10-23 ENCOUNTER — Other Ambulatory Visit: Payer: Self-pay

## 2019-10-23 ENCOUNTER — Ambulatory Visit (INDEPENDENT_AMBULATORY_CARE_PROVIDER_SITE_OTHER): Payer: BC Managed Care – PPO | Admitting: Sports Medicine

## 2019-10-23 ENCOUNTER — Encounter: Payer: Self-pay | Admitting: Sports Medicine

## 2019-10-23 DIAGNOSIS — R600 Localized edema: Secondary | ICD-10-CM | POA: Diagnosis not present

## 2019-10-23 NOTE — Assessment & Plan Note (Addendum)
For several this pleasant 54 year old female has had worsening bilateral lower extremity edema, on exam nonpitting with a negative Homans' sign good pulses. She denies any paroxysmal nocturnal dyspnea, orthopnea, abnormalities in her urination, or history of liver problems. I did advise her that in general mild nonpitting lower extremity edema was likely age-related and benign, and did not need treatment, other than lower extremity compression stockings. I am however going to rule out cardiac, nephrotic, and hepatic processes, CBC, CMP, TSH, urinalysis, BNP. Return to see [place] as needed.

## 2019-10-23 NOTE — Patient Instructions (Signed)
Edema  Edema is when you have too much fluid in your body or under your skin. Edema may make your legs, feet, and ankles swell up. Swelling is also common in looser tissues, like around your eyes. This is a common condition. It gets more common as you get older. There are many possible causes of edema. Eating too much salt (sodium) and being on your feet or sitting for a long time can cause edema in your legs, feet, and ankles. Hot weather may make edema worse. Edema is usually painless. Your skin may look swollen or shiny. Follow these instructions at home:  Keep the swollen body part raised (elevated) above the level of your heart when you are sitting or lying down.  Do not sit still or stand for a long time.  Do not wear tight clothes. Do not wear garters on your upper legs.  Exercise your legs. This can help the swelling go down.  Wear elastic bandages or support stockings as told by your doctor.  Eat a low-salt (low-sodium) diet to reduce fluid as told by your doctor.  Depending on the cause of your swelling, you may need to limit how much fluid you drink (fluid restriction).  Take over-the-counter and prescription medicines only as told by your doctor. Contact a doctor if:  Treatment is not working.  You have heart, liver, or kidney disease and have symptoms of edema.  You have sudden and unexplained weight gain. Get help right away if:  You have shortness of breath or chest pain.  You cannot breathe when you lie down.  You have pain, redness, or warmth in the swollen areas.  You have heart, liver, or kidney disease and get edema all of a sudden.  You have a fever and your symptoms get worse all of a sudden. Summary  Edema is when you have too much fluid in your body or under your skin.  Edema may make your legs, feet, and ankles swell up. Swelling is also common in looser tissues, like around your eyes.  Raise (elevate) the swollen body part above the level of your  heart when you are sitting or lying down.  Follow your doctor's instructions about diet and how much fluid you can drink (fluid restriction). This information is not intended to replace advice given to you by your health care provider. Make sure you discuss any questions you have with your health care provider. Document Revised: 07/05/2017 Document Reviewed: 07/20/2016 Elsevier Patient Education  2020 Elsevier Inc.  

## 2019-10-23 NOTE — Progress Notes (Signed)
    Procedures performed today:    None.  Independent interpretation of notes and tests performed by another provider:   None.  Brief History, Exam, Impression, and Recommendations:    Bilateral lower extremity edema For several this pleasant 54 year old female has had worsening bilateral lower extremity edema, on exam nonpitting with a negative Homans' sign good pulses. She denies any paroxysmal nocturnal dyspnea, orthopnea, abnormalities in her urination, or history of liver problems. I did advise her that in general mild nonpitting lower extremity edema was likely age-related and benign, and did not need treatment, other than lower extremity compression stockings. I am however going to rule out cardiac, nephrotic, and hepatic processes, CBC, CMP, urinalysis, BNP. Return to see [place] as needed.    ___________________________________________ Gwen Her. Dianah Field, M.D., ABFM., CAQSM. Primary Care and [place] Instructor of [place] of [facility] of Medicine

## 2019-10-24 LAB — TSH: TSH: 1.23 mIU/L

## 2019-10-25 LAB — COMPLETE METABOLIC PANEL WITH GFR
AG Ratio: 1.5 (calc) (ref 1.0–2.5)
ALT: 22 U/L (ref 6–29)
AST: 23 U/L (ref 10–35)
Albumin: 4 g/dL (ref 3.6–5.1)
Alkaline phosphatase (APISO): 54 U/L (ref 37–153)
BUN: 11 mg/dL (ref 7–25)
CO2: 28 mmol/L (ref 20–32)
Calcium: 9.9 mg/dL (ref 8.6–10.4)
Chloride: 104 mmol/L (ref 98–110)
Creat: 0.77 mg/dL (ref 0.50–1.05)
GFR, Est African American: 101 mL/min/{1.73_m2} (ref >=60)
GFR, Est Non African American: 88 mL/min/{1.73_m2} (ref >=60)
Globulin: 2.7 g/dL (calc) (ref 1.9–3.7)
Glucose, Bld: 75 mg/dL (ref 65–139)
Potassium: 4.6 mmol/L (ref 3.5–5.3)
Sodium: 140 mmol/L (ref 135–146)
Total Bilirubin: 0.4 mg/dL (ref 0.2–1.2)
Total Protein: 6.7 g/dL (ref 6.1–8.1)

## 2019-10-25 LAB — CBC
HCT: 39.9 % (ref 35.0–45.0)
Hemoglobin: 12.8 g/dL (ref 11.7–15.5)
MCH: 28.3 pg (ref 27.0–33.0)
MCHC: 32.1 g/dL (ref 32.0–36.0)
MCV: 88.3 fL (ref 80.0–100.0)
MPV: 11.1 fL (ref 7.5–12.5)
Platelets: 270 10*3/uL (ref 140–400)
RBC: 4.52 10*6/uL (ref 3.80–5.10)
RDW: 12.3 % (ref 11.0–15.0)
WBC: 4.4 10*3/uL (ref 3.8–10.8)

## 2019-10-25 LAB — URINALYSIS W MICROSCOPIC + REFLEX CULTURE
Bacteria, UA: NONE SEEN /HPF
Bilirubin Urine: NEGATIVE
Glucose, UA: NEGATIVE
Hgb urine dipstick: NEGATIVE
Hyaline Cast: NONE SEEN /LPF
Ketones, ur: NEGATIVE
Nitrites, Initial: NEGATIVE
Protein, ur: NEGATIVE
RBC / HPF: NONE SEEN /HPF (ref 0–2)
Specific Gravity, Urine: 1.004 (ref 1.001–1.03)
pH: 7.5 (ref 5.0–8.0)

## 2019-10-25 LAB — URINE CULTURE
MICRO NUMBER:: 10349271
SPECIMEN QUALITY:: ADEQUATE

## 2019-10-25 LAB — CULTURE INDICATED

## 2019-10-25 LAB — BRAIN NATRIURETIC PEPTIDE: Brain Natriuretic Peptide: 24 pg/mL (ref <100)

## 2019-10-26 ENCOUNTER — Ambulatory Visit: Payer: BC Managed Care – PPO | Admitting: Physician Assistant

## 2019-12-03 ENCOUNTER — Telehealth: Payer: Self-pay

## 2019-12-03 NOTE — Telephone Encounter (Signed)
[name] states she was seen by Dr T for bilateral swelling in thighs, knees and legs. She did try the compression stockings as directed. She wanted to know if she could get a pill to reduce the swelling. Denies leg pain.

## 2019-12-04 NOTE — Telephone Encounter (Signed)
To PCP

## 2019-12-07 MED ORDER — HYDROCHLOROTHIAZIDE 12.5 MG PO TABS: ORAL_TABLET | ORAL | 0 refills | Status: DC

## 2019-12-07 NOTE — Telephone Encounter (Signed)
I sent HCTZ a few tablets. Lets try this as needed for leg swelling but I want to see you in the office in next 2 weeks to see how working. Will need to check kidney function on this. If not helping like should may need to consider other work up.

## 2019-12-08 NOTE — Telephone Encounter (Signed)
Patient advised of recommendations. Also she has been scheduled for follow up.

## 2019-12-11 ENCOUNTER — Ambulatory Visit (INDEPENDENT_AMBULATORY_CARE_PROVIDER_SITE_OTHER): Payer: BC Managed Care – PPO | Admitting: Sports Medicine

## 2019-12-11 DIAGNOSIS — R600 Localized edema: Secondary | ICD-10-CM | POA: Diagnosis not present

## 2019-12-11 NOTE — Progress Notes (Signed)
    Procedures performed today:    None.  Independent interpretation of notes and tests performed by another provider:   None.  Brief History, Exam, Impression, and Recommendations:    Bilateral lower extremity edema This is a pleasant 54 year old female, she continues to have lower extremity swelling, at the last visit we did a CBC, CMP, TSH, urinalysis, BNP, all were essentially unremarkable. She denied any orthopnea or PND. She was given some hydrochlorothiazide by her PCP which helped slightly. She says she tried lower extremity compression stockings which were not very effective. She is significantly worried well today. She does have a bit of hyperpigmentation around her lower legs just above the malleoli consistent with venous stasis dermatitis. I reassured her that this was a normal phenomenon with aging, we discussed compliance of venous vascular structures as well as incompetence of the backflow valves and veins. We discussed weight loss, she has gained 7 to 10 pounds over the past several months, we also discussed a low-sodium diet, elevation of the legs and exercise. She seemed somewhat reassured. I also reminded her to keep these follow-ups with her PCP.  I spent 30 minutes of total time managing this patient today, this includes chart review, face to face, and non-face to face time.   ___________________________________________ Gwen Her. Dianah Field, M.D., ABFM., CAQSM. Primary Care and [place] Instructor of [place] of [facility] of Medicine

## 2019-12-11 NOTE — Assessment & Plan Note (Signed)
This is a pleasant 54 year old female, she continues to have lower extremity swelling, at the last visit we did a CBC, CMP, TSH, urinalysis, BNP, all were essentially unremarkable. She denied any orthopnea or PND. She was given some hydrochlorothiazide by her PCP which helped slightly. She says she tried lower extremity compression stockings which were not very effective. She is significantly worried well today. She does have a bit of hyperpigmentation around her lower legs just above the malleoli consistent with venous stasis dermatitis. I reassured her that this was a normal phenomenon with aging, we discussed compliance of venous vascular structures as well as incompetence of the backflow valves and veins. We discussed weight loss, she has gained 7 to 10 pounds over the past several months, we also discussed a low-sodium diet, elevation of the legs and exercise. She seemed somewhat reassured. I also reminded her to keep these follow-ups with her PCP.

## 2019-12-23 ENCOUNTER — Ambulatory Visit: Payer: BC Managed Care – PPO | Admitting: Physician Assistant

## 2020-01-01 ENCOUNTER — Other Ambulatory Visit: Payer: Self-pay | Admitting: Neurology

## 2020-01-01 MED ORDER — HYDROCHLOROTHIAZIDE 12.5 MG PO TABS: ORAL_TABLET | ORAL | 0 refills | Status: DC

## 2020-01-14 ENCOUNTER — Other Ambulatory Visit: Payer: Self-pay | Admitting: Physician Assistant

## 2020-01-14 DIAGNOSIS — Z1231 Encounter for screening mammogram for malignant neoplasm of breast: Secondary | ICD-10-CM

## 2020-01-15 ENCOUNTER — Other Ambulatory Visit: Payer: Self-pay | Admitting: Physician Assistant

## 2020-02-11 ENCOUNTER — Encounter: Payer: Self-pay | Admitting: Family Medicine

## 2020-02-11 ENCOUNTER — Other Ambulatory Visit: Payer: Self-pay

## 2020-02-11 ENCOUNTER — Ambulatory Visit (INDEPENDENT_AMBULATORY_CARE_PROVIDER_SITE_OTHER): Payer: BC Managed Care – PPO | Admitting: Family Medicine

## 2020-02-11 DIAGNOSIS — H6590 Unspecified nonsuppurative otitis media, unspecified ear: Secondary | ICD-10-CM | POA: Insufficient documentation

## 2020-02-11 DIAGNOSIS — H6501 Acute serous otitis media, right ear: Secondary | ICD-10-CM | POA: Diagnosis not present

## 2020-02-11 MED ORDER — FLUTICASONE PROPIONATE 50 MCG/ACT NA SUSP
2.0000 | Freq: Every day | NASAL | 6 refills | Status: DC
Start: 2020-02-11 — End: 2022-04-11

## 2020-02-11 NOTE — Progress Notes (Signed)
[name] - 54 y.o. female MRN [id number]  Date of birth: 01-Apr-1966  Subjective Chief Complaint  Patient presents with  . Tinnitus    HPI [name] is a 54 y.o. female here today with complaint of tinnitus of the R ear.  She reports that symptoms started about 1 week ago.  This was fairly sudden in onset.  She has had some nasal congestion and feeling of fullness in her ear.  She denies pain or popping sensation, drainage from the ear, headaches or dizziness.   ROS:  A comprehensive ROS was completed and negative except as noted per HPI   Allergies  Allergen Reactions  . Amoxicillin Other (See Comments)    "It just doesn't work, it sits in my body and doesn't do what it needs to do." "just sits in my body & doesn't do anything"    Past Medical History:  Diagnosis Date  . Asthma   . Esophageal stricture 12/16/2012   Dilated Spring 2014   . Irregular menstrual bleeding 02/27/2011  . Tachycardia     Past Surgical History:  Procedure Laterality Date  . CESAREAN SECTION     x 2 last in 99  . NASAL POLYP SURGERY    . TUBAL LIGATION  2000    Social History   Socioeconomic History  . Marital status: Married    Spouse name: Not on file  . Number of children: Not on file  . Years of education: Not on file  . Highest education level: Not on file  Occupational History  . 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 Smoking status: Never Smoker  . Smokeless tobacco: Never Used  Substance and Sexual Activity  . Alcohol use: Yes    Comment: occasionally  . Drug use: No  . Sexual activity: Yes    Partners: Male    Birth control/protection: Surgical, None  Other Topics Concern  . Not on file  Social History Narrative  . Not on file   Social Determinants of Health   Financial Resource Strain:   . Difficulty of Paying Living Expenses:   Food Insecurity:   . Worried About Charity fundraiser in the Last Year:   . Arboriculturist in the  Last Year:   Transportation Needs:   . Film/video editor (Medical):   Marland Kitchen Lack of Transportation (Non-Medical):   Physical Activity:   . Days of Exercise per Week:   . Minutes of Exercise per Session:   Stress:   . Feeling of Stress :   Social Connections:   . Frequency of Communication with Friends and Family:   . Frequency of Social Gatherings with Friends and Family:   . Attends Religious Services:   . Active Member of Clubs or Organizations:   . Attends Archivist Meetings:   Marland Kitchen Marital Status:     Family History  Problem Relation Age of Onset  . Diabetes Mother   . Hypertension Mother     Health Maintenance  Topic Date Due  . INFLUENZA VACCINE  02/14/2020  . DEXA SCAN  04/09/2020  . MAMMOGRAM  02/18/2021  . PAP SMEAR-Modifier  03/20/2021  . TETANUS/TDAP  02/10/2024  . COLONOSCOPY  11/13/2024  . Hepatitis C Screening  Completed  . HIV Screening  Completed     ----------------------------------------------------------------------------------------------------------------------------------------------------------------------------------------------------------------- Physical Exam BP 117/67 (BP Location: Left Arm, Patient Position: Sitting, Cuff Size: Normal)   Pulse 79   Temp 97.7 F (36.5 C) (  Temporal)   Ht 5' 8.11" (1.73 m)   Wt 157 lb 14.4 oz (71.6 kg)   SpO2 99%   BMI 23.93 kg/m   Physical Exam Constitutional:      Appearance: Normal appearance.  HENT:     Head: Normocephalic and atraumatic.     Right Ear: Ear canal normal.     Left Ear: Tympanic membrane and ear canal normal.     Ears:     Comments: Serous effusion of R ear Eyes:     General: No scleral icterus. Cardiovascular:     Rate and Rhythm: Normal rate and regular rhythm.  Pulmonary:     Effort: Pulmonary effort is normal.     Breath sounds: Normal breath sounds.  Musculoskeletal:     Cervical back: Neck supple.  Neurological:     Mental Status: She is alert.   Psychiatric:        Mood and Affect: Mood normal.     ------------------------------------------------------------------------------------------------------------------------------------------------------------------------------------------------------------------- Assessment and Plan  Serous otitis media I think tinnitus is due to middle ear effusion.  Recommend starting daily flonase Stay well hydrated Follow up if not improving over the next couple of weeks.    Meds ordered this encounter  Medications  . fluticasone (FLONASE) 50 MCG/ACT nasal spray    Sig: Place 2 sprays into both nostrils daily.    Dispense:  16 g    Refill:  6    No follow-ups on file.    This visit occurred during the SARS-CoV-2 public health emergency.  Safety protocols were in place, including screening questions prior to the visit, additional usage of staff PPE, and extensive cleaning of exam room while observing appropriate contact time as indicated for disinfecting solutions.

## 2020-02-11 NOTE — Patient Instructions (Signed)
Eustachian Tube Dysfunction ° °Eustachian tube dysfunction refers to a condition in which a blockage develops in the narrow passage that connects the middle ear to the back of the nose (eustachian tube). The eustachian tube regulates air pressure in the middle ear by letting air move between the ear and nose. It also helps to drain fluid from the middle ear space. °Eustachian tube dysfunction can affect one or both ears. When the eustachian tube does not function properly, air pressure, fluid, or both can build up in the middle ear. °What are the causes? °This condition occurs when the eustachian tube becomes blocked or cannot open normally. Common causes of this condition include: °· Ear infections. °· Colds and other infections that affect the nose, mouth, and throat (upper respiratory tract). °· Allergies. °· Irritation from cigarette smoke. °· Irritation from stomach acid coming up into the esophagus (gastroesophageal reflux). The esophagus is the tube that carries food from the mouth to the stomach. °· Sudden changes in air pressure, such as from descending in an airplane or scuba diving. °· Abnormal growths in the nose or throat, such as: °? Growths that line the nose (nasal polyps). °? Abnormal growth of cells (tumors). °? Enlarged tissue at the back of the throat (adenoids). °What increases the risk? °You are more likely to develop this condition if: °· You smoke. °· You are overweight. °· You are a child who has: °? Certain birth defects of the mouth, such as cleft palate. °? Large tonsils or adenoids. °What are the signs or symptoms? °Common symptoms of this condition include: °· A feeling of fullness in the ear. °· Ear pain. °· Clicking or popping noises in the ear. °· Ringing in the ear. °· Hearing loss. °· Loss of balance. °· Dizziness. °Symptoms may get worse when the air pressure around you changes, such as when you travel to an area of high elevation, fly on an airplane, or go scuba diving. °How is  this diagnosed? °This condition may be diagnosed based on: °· Your symptoms. °· A physical exam of your ears, nose, and throat. °· Tests, such as those that measure: °? The movement of your eardrum (tympanogram). °? Your hearing (audiometry). °How is this treated? °Treatment depends on the cause and severity of your condition. °· In mild cases, you may relieve your symptoms by moving air into your ears. This is called "popping the ears." °· In more severe cases, or if you have symptoms of fluid in your ears, treatment may include: °? Medicines to relieve congestion (decongestants). °? Medicines that treat allergies (antihistamines). °? Nasal sprays or ear drops that contain medicines that reduce swelling (steroids). °? A procedure to drain the fluid in your eardrum (myringotomy). In this procedure, a small tube is placed in the eardrum to: °§ Drain the fluid. °§ Restore the air in the middle ear space. °? A procedure to insert a balloon device through the nose to inflate the opening of the eustachian tube (balloon dilation). °Follow these instructions at home: °Lifestyle °· Do not do any of the following until your health care provider approves: °? Travel to high altitudes. °? Fly in airplanes. °? Work in a pressurized cabin or room. °? Scuba dive. °· Do not use any products that contain nicotine or tobacco, such as cigarettes and e-cigarettes. If you need help quitting, ask your health care provider. °· Keep your ears dry. Wear fitted earplugs during showering and bathing. Dry your ears completely after. °General instructions °· Take over-the-counter   and prescription medicines only as told by your health care provider. °· Use techniques to help pop your ears as recommended by your health care provider. These may include: °? Chewing gum. °? Yawning. °? Frequent, forceful swallowing. °? Closing your mouth, holding your nose closed, and gently blowing as if you are trying to blow air out of your nose. °· Keep all  follow-up visits as told by your health care provider. This is important. °Contact a health care provider if: °· Your symptoms do not go away after treatment. °· Your symptoms come back after treatment. °· You are unable to pop your ears. °· You have: °? A fever. °? Pain in your ear. °? Pain in your head or neck. °? Fluid draining from your ear. °· Your hearing suddenly changes. °· You become very dizzy. °· You lose your balance. °Summary °· Eustachian tube dysfunction refers to a condition in which a blockage develops in the eustachian tube. °· It can be caused by ear infections, allergies, inhaled irritants, or abnormal growths in the nose or throat. °· Symptoms include ear pain, hearing loss, or ringing in the ears. °· Mild cases are treated with maneuvers to unblock the ears, such as yawning or ear popping. °· Severe cases are treated with medicines. Surgery may also be done (rare). °This information is not intended to replace advice given to you by your health care provider. Make sure you discuss any questions you have with your health care provider. °Document Revised: 10/22/2017 Document Reviewed: 10/22/2017 °Elsevier Patient Education © 2020 [employer] ° °

## 2020-02-11 NOTE — Assessment & Plan Note (Addendum)
I think tinnitus is due to middle ear effusion.  Recommend starting daily flonase Stay well hydrated Follow up if not improving over the next couple of weeks.

## 2020-02-24 ENCOUNTER — Other Ambulatory Visit: Payer: Self-pay

## 2020-02-24 ENCOUNTER — Ambulatory Visit (INDEPENDENT_AMBULATORY_CARE_PROVIDER_SITE_OTHER): Payer: BC Managed Care – PPO

## 2020-02-24 DIAGNOSIS — Z1231 Encounter for screening mammogram for malignant neoplasm of breast: Secondary | ICD-10-CM

## 2020-02-25 NOTE — Progress Notes (Signed)
Normal mammogram. Follow up in 1 year.

## 2020-04-13 ENCOUNTER — Ambulatory Visit (INDEPENDENT_AMBULATORY_CARE_PROVIDER_SITE_OTHER): Payer: BC Managed Care – PPO | Admitting: Physician Assistant

## 2020-04-13 ENCOUNTER — Other Ambulatory Visit: Payer: Self-pay

## 2020-04-13 VITALS — BP 112/83 | HR 83 | Ht 68.0 in | Wt 151.0 lb

## 2020-04-13 DIAGNOSIS — Z13220 Encounter for screening for lipoid disorders: Secondary | ICD-10-CM

## 2020-04-13 DIAGNOSIS — Z131 Encounter for screening for diabetes mellitus: Secondary | ICD-10-CM | POA: Diagnosis not present

## 2020-04-13 DIAGNOSIS — Z13820 Encounter for screening for osteoporosis: Secondary | ICD-10-CM | POA: Diagnosis not present

## 2020-04-13 DIAGNOSIS — Z0000 Encounter for general adult medical examination without abnormal findings: Secondary | ICD-10-CM | POA: Diagnosis not present

## 2020-04-13 DIAGNOSIS — R600 Localized edema: Secondary | ICD-10-CM

## 2020-04-13 NOTE — Progress Notes (Addendum)
Subjective:     [name] is a 54 y.o. female and is here for a comprehensive physical exam. The patient reports problems - she is conerned with her ongoing intermittent bilateral leg swelling. worked up twice with no concerns. does not want to take HCTZ regularly. .    Social History   Socioeconomic History  . Marital status: Married    Spouse name: Not on file  . Number of children: Not on file  . Years of education: Not on file  . Highest education level: Not on file  Occupational History  . 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 Smoking status: Never Smoker  . Smokeless tobacco: Never Used  Substance and Sexual Activity  . Alcohol use: Yes    Comment: occasionally  . Drug use: No  . Sexual activity: Yes    Partners: Male    Birth control/protection: Surgical, None  Other Topics Concern  . Not on file  Social History Narrative  . Not on file   Social Determinants of Health   Financial Resource Strain:   . Difficulty of Paying Living Expenses: Not on file  Food Insecurity:   . Worried About Charity fundraiser in the Last Year: Not on file  . Ran Out of Food in the Last Year: Not on file  Transportation Needs:   . Lack of Transportation (Medical): Not on file  . Lack of Transportation (Non-Medical): Not on file  Physical Activity:   . Days of Exercise per Week: Not on file  . Minutes of Exercise per Session: Not on file  Stress:   . Feeling of Stress : Not on file  Social Connections:   . Frequency of Communication with Friends and Family: Not on file  . Frequency of Social Gatherings with Friends and Family: Not on file  . Attends Religious Services: Not on file  . Active Member of Clubs or Organizations: Not on file  . Attends Archivist Meetings: Not on file  . Marital Status: Not on file  Intimate Partner Violence:   . Fear of Current or Ex-Partner: Not on file  . Emotionally Abused: Not on file  . Physically  Abused: Not on file  . Sexually Abused: Not on file   Health Maintenance  Topic Date Due  . INFLUENZA VACCINE  02/14/2020  . DEXA SCAN  04/09/2020  . PAP SMEAR-Modifier  03/20/2021  . MAMMOGRAM  02/23/2022  . TETANUS/TDAP  02/10/2024  . COLONOSCOPY  11/13/2024  . COVID-19 Vaccine  Completed  . Hepatitis C Screening  Completed  . HIV Screening  Completed    The following portions of the patient's history were reviewed and updated as appropriate: allergies, current medications, past family history, past medical history, past social history, past surgical history and problem list.  Review of Systems A comprehensive review of systems was negative.   Objective:    BP 112/83   Pulse 83   Ht 5\' 8"  (1.727 m)   Wt 151 lb (68.5 kg)   SpO2 100%   BMI 22.96 kg/m  General appearance: alert, cooperative and appears stated age Head: Normocephalic, without obvious abnormality, atraumatic Eyes: conjunctivae/corneas clear. PERRL, EOM's intact. Fundi benign. Ears: normal TM's and external ear canals both ears right cerumen partial impaction. Nose: Nares normal. Septum midline. Mucosa normal. No drainage or sinus tenderness. Throat: lips, mucosa, and tongue normal; teeth and gums normal Neck: no adenopathy, no carotid bruit, no JVD, supple, symmetrical, trachea midline  and thyroid not enlarged, symmetric, no tenderness/mass/nodules Back: symmetric, no curvature. ROM normal. No CVA tenderness. Lungs: clear to auscultation bilaterally Heart: regular rate and rhythm, S1, S2 normal, no murmur, click, rub or gallop Abdomen: soft, non-tender; bowel sounds normal; no masses,  no organomegaly Extremities: extremities normal, atraumatic, no cyanosis or edema Pulses: 2+ and symmetric Skin: Skin color, texture, turgor normal. No rashes or lesions Lymph nodes: Cervical, supraclavicular, and axillary nodes normal. Neurologic: Alert and oriented X 3, normal strength and tone. Normal symmetric reflexes.  Normal coordination and gait    Maybe some scant non pitting edema on exam of lower bilateral legs but no concerns.   Depression screen [facility] 2/9 04/13/2020 04/09/2019 04/07/2018 04/07/2017 09/18/2016  Decreased Interest 0 0 0 0 1  Down, Depressed, Hopeless 0 0 0 0 0  PHQ - 2 Score 0 0 0 0 1  Altered sleeping - 0 0 - -  Tired, decreased energy - 0 0 - -  Change in appetite - 0 0 - -  Feeling bad or failure about yourself  - 0 0 - -  Trouble concentrating - 0 0 - -  Moving slowly or fidgety/restless - 0 0 - -  Suicidal thoughts - 0 0 - -  PHQ-9 Score - 0 0 - -  Difficult doing work/chores - Not difficult at all Not difficult at all - -   .Marland Kitchen GAD 7 : Generalized Anxiety Score 04/09/2019 04/07/2018  Nervous, Anxious, on Edge 0 0  Control/stop worrying 0 0  Worry too much - different things 0 0  Trouble relaxing 0 0  Restless 0 0  Easily annoyed or irritable 0 0  Afraid - awful might happen 0 0  Total GAD 7 Score 0 0  Anxiety Difficulty Not difficult at all Not difficult at all     Assessment:    Healthy female exam.      Plan:    Marland KitchenMarland KitchenKerin was seen today for annual exam.  Diagnoses and all orders for this visit:  Routine physical examination -     Lipid Panel w/reflex Direct LDL -     COMPLETE METABOLIC PANEL WITH GFR  Osteoporosis screening -     DG Bone Density  Screening for diabetes mellitus -     COMPLETE METABOLIC PANEL WITH GFR  Screening for lipid disorders -     Lipid Panel w/reflex Direct LDL   Discussed 150 minutes of exercise a week.  Encouraged vitamin D 1000 units and Calcium 1300mg  or 4 servings of dairy a day.  Bone density ordered. Pt is high risk with prednisone use.  Mammogram UTD. Colonoscopy UTD. Pt declined flu shot.  Pt is covid vaccinated.  shingrix UTD.  Reassured patient on edema. Complete work up was normal. Compression stockings. Watching salt in diet and if needed HCTZ is the best plan. I did not see any significant edema on todays  exam.   See After Visit Summary for Counseling Recommendations

## 2020-04-13 NOTE — Patient Instructions (Addendum)
Health Maintenance, Female Adopting a healthy lifestyle and getting preventive care are important in promoting health and wellness. Ask your health care provider about:  The right schedule for you to have regular tests and exams.  Things you can do on your own to prevent diseases and keep yourself healthy. What should I know about diet, weight, and exercise? Eat a healthy diet   Eat a diet that includes plenty of vegetables, fruits, low-fat dairy products, and lean protein.  Do not eat a lot of foods that are high in solid fats, added sugars, or sodium. Maintain a healthy weight Body mass index (BMI) is used to identify weight problems. It estimates body fat based on height and weight. Your health care provider can help determine your BMI and help you achieve or maintain a healthy weight. Get regular exercise Get regular exercise. This is one of the most important things you can do for your health. Most adults should:  Exercise for at least 150 minutes each week. The exercise should increase your heart rate and make you sweat (moderate-intensity exercise).  Do strengthening exercises at least twice a week. This is in addition to the moderate-intensity exercise.  Spend less time sitting. Even light physical activity can be beneficial. Watch cholesterol and blood lipids Have your blood tested for lipids and cholesterol at 55 years of age, then have this test every 5 years. Have your cholesterol levels checked more often if:  Your lipid or cholesterol levels are high.  You are older than 55 years of age.  You are at high risk for heart disease. What should I know about cancer screening? Depending on your health history and family history, you may need to have cancer screening at various ages. This may include screening for:  Breast cancer.  Cervical cancer.  Colorectal cancer.  Skin cancer.  Lung cancer. What should I know about heart disease, diabetes, and high blood  pressure? Blood pressure and heart disease  High blood pressure causes heart disease and increases the risk of stroke. This is more likely to develop in people who have high blood pressure readings, are of African descent, or are overweight.  Have your blood pressure checked: ? Every 3-5 years if you are 18-39 years of age. ? Every year if you are 40 years old or older. Diabetes Have regular diabetes screenings. This checks your fasting blood sugar level. Have the screening done:  Once every three years after age 40 if you are at a normal weight and have a low risk for diabetes.  More often and at a younger age if you are overweight or have a high risk for diabetes. What should I know about preventing infection? Hepatitis B If you have a higher risk for hepatitis B, you should be screened for this virus. Talk with your health care provider to find out if you are at risk for hepatitis B infection. Hepatitis C Testing is recommended for:  Everyone born from 1945 through 1965.  Anyone with known risk factors for hepatitis C. Sexually transmitted infections (STIs)  Get screened for STIs, including gonorrhea and chlamydia, if: ? You are sexually active and are younger than 55 years of age. ? You are older than 55 years of age and your health care provider tells you that you are at risk for this type of infection. ? Your sexual activity has changed since you were last screened, and you are at increased risk for chlamydia or gonorrhea. Ask your health care provider if   you are at risk.  Ask your health care provider about whether you are at high risk for HIV. Your health care provider may recommend a prescription medicine to help prevent HIV infection. If you choose to take medicine to prevent HIV, you should first get tested for HIV. You should then be tested every 3 months for as long as you are taking the medicine. Pregnancy  If you are about to stop having your period (premenopausal) and  you may become pregnant, seek counseling before you get pregnant.  Take 400 to 800 micrograms (mcg) of folic acid every day if you become pregnant.  Ask for birth control (contraception) if you want to prevent pregnancy. Osteoporosis and menopause Osteoporosis is a disease in which the bones lose minerals and strength with aging. This can result in bone fractures. If you are 16 years old or older, or if you are at risk for osteoporosis and fractures, ask your health care provider if you should:  Be screened for bone loss.  Take a calcium or vitamin D supplement to lower your risk of fractures.  Be given hormone replacement therapy (HRT) to treat symptoms of menopause. Follow these instructions at home: Lifestyle  Do not use any products that contain nicotine or tobacco, such as cigarettes, e-cigarettes, and chewing tobacco. If you need help quitting, ask your health care provider.  Do not use street drugs.  Do not share needles.  Ask your health care provider for help if you need support or information about quitting drugs. Alcohol use  Do not drink alcohol if: ? Your health care provider tells you not to drink. ? You are pregnant, may be pregnant, or are planning to become pregnant.  If you drink alcohol: ? Limit how much you use to 0-1 drink a day. ? Limit intake if you are breastfeeding.  Be aware of how much alcohol is in your drink. In the [place] one drink equals one 12 oz bottle of beer (355 mL), one 5 oz glass of wine (148 mL), or one 1 oz glass of hard liquor (44 mL). General instructions  Schedule regular health, dental, and eye exams.  Stay current with your vaccines.  Tell your health care provider if: ? You often feel depressed. ? You have ever been abused or do not feel safe at home. Summary  Adopting a healthy lifestyle and getting preventive care are important in promoting health and wellness.  Follow your health care provider's instructions about healthy  diet, exercising, and getting tested or screened for diseases.  Follow your health care provider's instructions on monitoring your cholesterol and blood pressure. This information is not intended to replace advice given to you by your health care provider. Make sure you discuss any questions you have with your health care provider. Document Revised: 06/25/2018 Document Reviewed: 06/25/2018 Elsevier Patient Education  [place].    Chronic Venous Insufficiency Chronic venous insufficiency is a condition where the leg veins cannot effectively pump blood from the legs to the heart. This happens when the vein walls are either stretched, weakened, or damaged, or when the valves inside the vein are damaged. With the right treatment, you should be able to continue with an active life. This condition is also called venous stasis. What are the causes? Common causes of this condition include:  High blood pressure inside the veins (venous hypertension).  Sitting or standing too long, causing increased blood pressure in the leg veins.  A blood clot that blocks blood flow  in a vein (deep vein thrombosis, DVT).  Inflammation of a vein (phlebitis) that causes a blood clot to form.  Tumors in the pelvis that cause blood to back up. What increases the risk? The following factors may make you more likely to develop this condition:  Having a family history of this condition.  Obesity.  Pregnancy.  Living without enough regular physical activity or exercise (sedentary lifestyle).  Smoking.  Having a job that requires long periods of standing or sitting in one place.  Being a certain age. Women in their 37s and 32s and men in their 53s are more likely to develop this condition. What are the signs or symptoms? Symptoms of this condition include:  Veins that are enlarged, bulging, or twisted (varicose veins).  Skin breakdown or ulcers.  Reddened skin or dark discoloration of skin on  the leg between the knee and ankle.  Brown, smooth, tight, and painful skin just above the ankle, usually on the inside of the leg (lipodermatosclerosis).  Swelling of the legs. How is this diagnosed? This condition may be diagnosed based on:  Your medical history.  A physical exam.  Tests, such as: ? A procedure that creates an image of a blood vessel and nearby organs and provides information about blood flow through the blood vessel (duplex ultrasound). ? A procedure that tests blood flow (plethysmography). ? A procedure that looks at the veins using X-ray and dye (venogram). How is this treated? The goals of treatment are to help you return to an active life and to minimize pain or disability. Treatment depends on the severity of your condition, and it may include:  Wearing compression stockings. These can help relieve symptoms and help prevent your condition from getting worse. However, they do not cure the condition.  Sclerotherapy. This procedure involves an injection of a solution that shrinks damaged veins.  Surgery. This may involve: ? Removing a diseased vein (vein stripping). ? Cutting off blood flow through the vein (laser ablation surgery). ? Repairing or reconstructing a valve within the affected vein. Follow these instructions at home:      Wear compression stockings as told by your health care provider. These stockings help to prevent blood clots and reduce swelling in your legs.  Take over-the-counter and prescription medicines only as told by your health care provider.  Stay active by exercising, walking, or doing different activities. Ask your health care provider what activities are safe for you and how much exercise you need.  Drink enough fluid to keep your urine pale yellow.  Do not use any products that contain nicotine or tobacco, such as cigarettes, e-cigarettes, and chewing tobacco. If you need help quitting, ask your health care provider.  Keep  all follow-up visits as told by your health care provider. This is important. Contact a health care provider if you:  Have redness, swelling, or more pain in the affected area.  See a red streak or line that goes up or down from the affected area.  Have skin breakdown or skin loss in the affected area, even if the breakdown is small.  Get an injury in the affected area. Get help right away if:  You get an injury and an open wound in the affected area.  You have: ? Severe pain that does not get better with medicine. ? Sudden numbness or weakness in the foot or ankle below the affected area. ? Trouble moving your foot or ankle. ? A fever. ? Worse or persistent symptoms. ?  Chest pain. ? Shortness of breath. Summary  Chronic venous insufficiency is a condition where the leg veins cannot effectively pump blood from the legs to the heart.  Chronic venous insufficiency occurs when the vein walls become stretched, weakened, or damaged, or when valves within the vein are damaged.  Treatment depends on how severe your condition is. It often involves wearing compression stockings and may involve having a procedure.  Make sure you stay active by exercising, walking, or doing different activities. Ask your health care provider what activities are safe for you and how much exercise you need. This information is not intended to replace advice given to you by your health care provider. Make sure you discuss any questions you have with your health care provider. Document Revised: 03/25/2018 Document Reviewed: 03/25/2018 Elsevier Patient Education  [place].

## 2020-04-14 LAB — LIPID PANEL W/REFLEX DIRECT LDL
Cholesterol: 241 mg/dL — ABNORMAL HIGH (ref <200)
HDL: 85 mg/dL (ref >=50)
LDL Cholesterol (Calc): 140 mg/dL (calc) — ABNORMAL HIGH
Non-HDL Cholesterol (Calc): 156 mg/dL (calc) — ABNORMAL HIGH (ref <130)
Total CHOL/HDL Ratio: 2.8 (calc) (ref <5.0)
Triglycerides: 70 mg/dL (ref <150)

## 2020-04-14 LAB — COMPLETE METABOLIC PANEL WITH GFR
AG Ratio: 1.5 (calc) (ref 1.0–2.5)
ALT: 19 U/L (ref 6–29)
AST: 22 U/L (ref 10–35)
Albumin: 4.2 g/dL (ref 3.6–5.1)
Alkaline phosphatase (APISO): 60 U/L (ref 37–153)
BUN: 13 mg/dL (ref 7–25)
CO2: 32 mmol/L (ref 20–32)
Calcium: 10.3 mg/dL (ref 8.6–10.4)
Chloride: 103 mmol/L (ref 98–110)
Creat: 0.89 mg/dL (ref 0.50–1.05)
GFR, Est African American: 85 mL/min/{1.73_m2} (ref >=60)
GFR, Est Non African American: 73 mL/min/{1.73_m2} (ref >=60)
Globulin: 2.8 g/dL (calc) (ref 1.9–3.7)
Glucose, Bld: 87 mg/dL (ref 65–139)
Potassium: 4.3 mmol/L (ref 3.5–5.3)
Sodium: 142 mmol/L (ref 135–146)
Total Bilirubin: 0.4 mg/dL (ref 0.2–1.2)
Total Protein: 7 g/dL (ref 6.1–8.1)

## 2020-04-15 ENCOUNTER — Encounter: Payer: Self-pay | Admitting: Physician Assistant

## 2020-04-15 NOTE — Progress Notes (Signed)
[name],   Normal kidney, glucose, liver.  Your cholesterol continues to trend up. Your good cholesterol does remain good. Your overall 10 year cardiovascular risk is 2.1 percent which is still low and good. No indication for medication at this time. Continue to watch fried/fatty/processed foods.

## 2020-05-08 ENCOUNTER — Other Ambulatory Visit: Payer: Self-pay | Admitting: Physician Assistant

## 2020-05-08 DIAGNOSIS — I471 Supraventricular tachycardia, unspecified: Secondary | ICD-10-CM

## 2020-09-02 ENCOUNTER — Ambulatory Visit (INDEPENDENT_AMBULATORY_CARE_PROVIDER_SITE_OTHER): Payer: BC Managed Care – PPO | Admitting: Physician Assistant

## 2020-09-02 DIAGNOSIS — Z5329 Procedure and treatment not carried out because of patient's decision for other reasons: Secondary | ICD-10-CM

## 2020-09-02 NOTE — Progress Notes (Signed)
No show

## 2020-09-14 ENCOUNTER — Other Ambulatory Visit (HOSPITAL_COMMUNITY)
Admission: RE | Admit: 2020-09-14 | Discharge: 2020-09-14 | Disposition: A | Discharge status: Home / Self Care | Payer: BC Managed Care – PPO | Source: Ambulatory Visit | Attending: Physician Assistant | Admitting: Physician Assistant

## 2020-09-14 ENCOUNTER — Ambulatory Visit (INDEPENDENT_AMBULATORY_CARE_PROVIDER_SITE_OTHER): Payer: BC Managed Care – PPO | Admitting: Physician Assistant

## 2020-09-14 ENCOUNTER — Other Ambulatory Visit: Payer: Self-pay

## 2020-09-14 ENCOUNTER — Encounter: Payer: Self-pay | Admitting: Physician Assistant

## 2020-09-14 VITALS — BP 123/75 | HR 90 | Ht 68.0 in | Wt 150.0 lb

## 2020-09-14 DIAGNOSIS — Z01419 Encounter for gynecological examination (general) (routine) without abnormal findings: Secondary | ICD-10-CM | POA: Insufficient documentation

## 2020-09-14 DIAGNOSIS — N930 Postcoital and contact bleeding: Secondary | ICD-10-CM | POA: Diagnosis not present

## 2020-09-14 DIAGNOSIS — H6123 Impacted cerumen, bilateral: Secondary | ICD-10-CM | POA: Diagnosis not present

## 2020-09-14 DIAGNOSIS — N952 Postmenopausal atrophic vaginitis: Secondary | ICD-10-CM | POA: Insufficient documentation

## 2020-09-14 NOTE — Progress Notes (Signed)
Subjective:    Patient ID: [name], female    DOB: 1966-06-07, 55 y.o.   MRN: [id number]  HPI  Patient is a 55 year old postmenopausal female paroxysmal supraventricular tachycardia, eosinophilic asthma, hyperlipidemia who presents to the clinic with bilateral hearing loss and history of cerumen impaction and concerns with bleeding after intercourse.  Patient has a history of cerumen impaction.  Occasionally she does get her ears cleaned out.  She is noticing a lot of pressure and hearing loss in both ears.  She would like them cleaned out.  She denies any sinus pressure, ear pain, sore throat, cough, fever, body aches.  She is not using anything regularly to keep her ears cleaned out.  Patient has noticed some scant blood-tinged discharge after sex for the last month.  Her last Pap smear was normal in 2019.  She only has 1 sexual partner.  She denies any pain with intercourse or abnormal vaginal discharge.  She is concerned about the blood-tinged.  She is having other menopausal symptoms but no other vaginal symptoms. Denies any flank pain and/or abdominal pain. Not having periods.   .. Active Ambulatory Problems    Diagnosis Date Noted  . Irregular menstrual bleeding 02/27/2011  . Esophageal stricture 12/16/2012  . Anemia 12/22/2012  . Paroxysmal supraventricular tachycardia ([place]) 01/28/2013  . Constipation 09/21/2013  . Allergic rhinitis 09/21/2013  . Asthma, chronic 02/09/2014  . Nasal polyposis 03/25/2014  . Cerumen impaction 03/28/2016  . Left breast mass 06/04/2016  . Teeth grinding 11/16/2016  . Tingling of face 11/16/2016  . Bruises easily 04/07/2017  . Temporal pain 04/07/2017  . Flatulence 06/30/2017  . Eosinophilic asthma 45/62/5638  . Hyperlipidemia 04/13/2019  . Bilateral lower extremity edema 10/23/2019  . Serous otitis media 02/11/2020  . Bleeding after intercourse 09/14/2020  . Bilateral hearing loss due to cerumen impaction 09/14/2020  . Atrophic  vaginitis 09/14/2020   Resolved Ambulatory Problems    Diagnosis Date Noted  . Asthma with acute exacerbation 03/30/2016  . Pneumonia of right lower lobe due to infectious organism 08/14/2017   Past Medical History:  Diagnosis Date  . Asthma   . Tachycardia     Review of Systems  All other systems reviewed and are negative.      Objective:   Physical Exam Vitals reviewed.  Constitutional:      Appearance: Normal appearance.  HENT:     Head: Normocephalic.     Right Ear: There is impacted cerumen.     Left Ear: There is impacted cerumen.     Nose: Nose normal.  Neck:     Vascular: No carotid bruit.  Cardiovascular:     Rate and Rhythm: Normal rate and regular rhythm.     Pulses: Normal pulses.  Pulmonary:     Effort: Pulmonary effort is normal.     Breath sounds: Normal breath sounds.  Abdominal:     General: Bowel sounds are normal. There is no distension.     Palpations: Abdomen is soft. There is no mass.     Tenderness: There is no abdominal tenderness. There is no right CVA tenderness, left CVA tenderness, guarding or rebound.  Genitourinary:    General: Normal vulva.     Vagina: No vaginal discharge.     Comments: Flesh appendage at 12 oclock Cervical os pale and atrophic with friability after cytology brush used.  Vaginal walls pale and atrophic. Neurological:     General: No focal deficit present.     Mental Status:  She is alert and oriented to person, place, and time.  Psychiatric:        Mood and Affect: Mood normal.           Assessment & Plan:  Marland KitchenMarland KitchenJoelynn was seen today for ear problem.  Diagnoses and all orders for this visit:  Bleeding after intercourse -     Cytology - PAP  Bilateral hearing loss due to cerumen impaction  Encounter for routine gynecological examination with Papanicolaou smear of cervix -     Cytology - PAP  Atrophic vaginitis   .Marland KitchenCerumen Removal Template: Indication: Cerumen impaction of the ear(s) Medical  necessity statement: On physical examination, cerumen impairs clinically significant portions of the external auditory canal, and tympanic membrane. Noted obstructive, copious cerumen that cannot be removed without magnification and instrumentations requiring physician skills Consent: Discussed benefits and risks of procedure and verbal consent obtained Procedure: Patient was prepped for the procedure. Utilized an otoscope to assess and take note of the ear canal, the tympanic membrane, and the presence, amount, and placement of the cerumen. Gentle water irrigation and soft plastic curette was utilized to remove cerumen.  Post procedure examination shows cerumen was completely removed. Patient tolerated procedure well. The patient is made aware that they may experience temporary vertigo, temporary hearing loss, and temporary discomfort. If these symptom last for more than 24 hours to call the clinic or proceed to the ED.  Bilateral ears were irrigated today.  TMs look great behind them.  Discussed Debrox for maintenance.  She may return to the clinic at any time for irrigation.  Discussed etiology for bleeding with intercourse.  I do not see any visible concerns on pelvic exam.  She does appear to have some atrophic changes to her vagina.  Discussed this is part of menopause.  She should consider vaginal estrogen in the future.  I did repeat Pap smear to make sure no abnormal cells.  If patient has any new symptoms we could consider pelvic ultrasound in the future.

## 2020-09-14 NOTE — Patient Instructions (Addendum)
Debrox for cerumen removal.  Estradiol vaginal cream What is this medicine? ESTRADIOL (es tra DYE ole) contains the female hormone estrogen. It is used for symptoms of menopause, like vaginal dryness and irritation. This medicine may be used for other purposes; ask your health care provider or pharmacist if you have questions. COMMON BRAND NAME(S): Estrace What should I tell my health care provider before I take this medicine? They need to know if you have any of these conditions:  abnormal vaginal bleeding  blood vessel disease or blood clots  breast, cervical, endometrial, ovarian, liver, or uterine cancer  dementia  diabetes  gallbladder disease  heart disease or recent heart attack  high blood pressure  high cholesterol  high levels of calcium in the blood  hysterectomy  kidney disease  liver disease  migraine headaches  protein C deficiency  protein S deficiency  stroke  systemic lupus erythematosus (SLE)  tobacco smoker  an unusual or allergic reaction to estrogens, other hormones, soy, other medicines, foods, dyes, or preservatives  pregnant or trying to get pregnant  breast-feeding How should I use this medicine? This medicine is for use in the vagina only. Do not take by mouth. Follow the directions on the prescription label. Read package directions carefully before using. Use the special applicator supplied with the cream. Wash hands before and after use. Fill the applicator with the prescribed amount of cream. Lie on your back, part and bend your knees. Insert the applicator into the vagina and push the plunger to expel the cream into the vagina. Wash the applicator with warm soapy water and rinse well. Use exactly as directed for the complete length of time prescribed. Do not stop using except on the advice of your doctor or health care professional. A patient package insert for the product will be given with each prescription and refill. Read this  sheet carefully each time. The sheet may change frequently. Talk to your pediatrician regarding the use of this medicine in children. This medicine is not approved for use in children. Overdosage: If you think you have taken too much of this medicine contact a poison control center or emergency room at once. NOTE: This medicine is only for you. Do not share this medicine with others. What if I miss a dose? If you miss a dose, use it as soon as you can. If it is almost time for your next dose, use only that dose. Do not use double or extra doses. What may interact with this medicine? Do not take this medicine with any of the following medications:  aromatase inhibitors like aminoglutethimide, anastrozole, exemestane, letrozole, testolactone This medicine may also interact with the following medications:  barbiturates used for inducing sleep or treating seizures  carbamazepine  grapefruit juice  medicines for fungal infections like ketoconazole and itraconazole  raloxifene  rifabutin  rifampin  rifapentine  ritonavir  some antibiotics used to treat infections  St. John's Wort  tamoxifen  warfarin This list may not describe all possible interactions. Give your health care provider a list of all the medicines, herbs, non-prescription drugs, or dietary supplements you use. Also tell them if you smoke, drink alcohol, or use illegal drugs. Some items may interact with your medicine. What should I watch for while using this medicine? Visit your health care professional for regular checks on your progress. You will need a regular breast and pelvic exam. You should also discuss the need for regular mammograms with your health care professional, and follow his  or her guidelines. This medicine can make your body retain fluid, making your fingers, hands, or ankles swell. Your blood pressure can go up. Contact your doctor or health care professional if you feel you are retaining fluid. If  you have any reason to think you are pregnant, stop taking this medicine at once and contact your doctor or health care professional. Tobacco smoking increases the risk of getting a blood clot or having a stroke, especially if you are more than 56 years old. You are strongly advised not to smoke. If you wear contact lenses and notice visual changes, or if the lenses begin to feel uncomfortable, consult your eye care specialist. If you are going to have elective surgery, you may need to stop taking this medicine beforehand. Consult your health care professional for advice prior to scheduling the surgery. What side effects may I notice from receiving this medicine? Side effects that you should report to your doctor or health care professional as soon as possible:  allergic reactions like skin rash, itching or hives, swelling of the face, lips, or tongue  breast tissue changes or discharge  changes in vision  chest pain  confusion, trouble speaking or understanding  dark urine  general ill feeling or flu-like symptoms  light-colored stools  nausea, vomiting  pain, swelling, warmth in the leg  right upper belly pain  severe headaches  shortness of breath  sudden numbness or weakness of the face, arm or leg  trouble walking, dizziness, loss of balance or coordination  unusual vaginal bleeding  yellowing of the eyes or skin Side effects that usually do not require medical attention (report to your doctor or health care professional if they continue or are bothersome):  hair loss  increased hunger or thirst  increased urination  symptoms of vaginal infection like itching, irritation or unusual discharge  unusually weak or tired This list may not describe all possible side effects. Call your doctor for medical advice about side effects. You may report side effects to FDA at 1-800-FDA-1088. Where should I keep my medicine? Keep out of the reach of children. Store at room  temperature between 15 and 30 degrees C (59 and 86 degrees F). Protect from temperatures above 40 degrees C (104 degrees C). Do not freeze. Throw away any unused medicine after the expiration date. NOTE: This sheet is a summary. It may not cover all possible information. If you have questions about this medicine, talk to your doctor, pharmacist, or health care provider.  2021 Elsevier/Gold Standard (2010-10-04 09:18:12) Atrophic Vaginitis  Atrophic vaginitis is when the lining of the vagina becomes dry and thin. This is most common in women who have stopped having their periods (are in menopause). It usually starts when a woman is 58 to 56 years old. What are the causes? This condition is caused by a drop in a female hormone (estrogen). What increases the risk? You are more likely to develop this condition if:  You take certain medicines.  You have had your ovaries taken out.  You are being treated for cancer.  You have given birth or are breastfeeding.  You are more than 56 years old.  You smoke. What are the signs or symptoms?  Pain during sex.  A feeling of pressure during sex.  Bleeding during sex.  Burning or itching in the vagina.  Burning pain when you pee (urinate).  Fluid coming from your vagina. Some people do not have symptoms. How is this treated?  Using a  lubricant before sex.  Using a moisturizer in the vagina.  Using estrogen in the vagina. In some cases, you may not need treatment. Follow these instructions at home: Medicines  Take all medicines only as told by your doctor. This includes medicines for dryness.  Do not use herbal medicines unless your doctor says it is okay. General instructions  Talk with your doctor about treatment.  Do not douche.  Do not use scented: ? Sprays. ? Tampons. ? Soaps.  If sex hurts, try using lubricants right before you have sex. Contact a doctor if:  You have fluid coming from the vagina that is not  like normal.  You have a bad smell coming from your vagina.  You have new symptoms.  Your symptoms do not get better when treated.  Your symptoms get worse. Summary  This condition happens when the lining of the vagina becomes dry and thin.  It is most common in women who no longer have periods.  Treatment may include using medicines for dryness.  Call a doctor if your symptoms do not get better. This information is not intended to replace advice given to you by your health care provider. Make sure you discuss any questions you have with your health care provider. Document Revised: 12/31/2019 Document Reviewed: 12/31/2019 Elsevier Patient Education  [employer].

## 2020-09-15 LAB — CYTOLOGY - PAP
Comment: NEGATIVE
Diagnosis: NEGATIVE
High risk HPV: NEGATIVE

## 2020-09-16 NOTE — Progress Notes (Signed)
[name],   Pap showed normal cells and no HPV. GREAT news. Have you thought about trying vaginal estrogen for your symptoms?

## 2020-12-03 IMAGING — MG DIGITAL SCREENING BILAT W/ TOMO W/ CAD
8 series · 8 of 24 positions shown · non-contrast
Comparison: Previous exam(s).

CLINICAL DATA: Screening.

EXAM:
DIGITAL SCREENING BILATERAL MAMMOGRAM WITH TOMO AND CAD

[L MLO synth-2D]
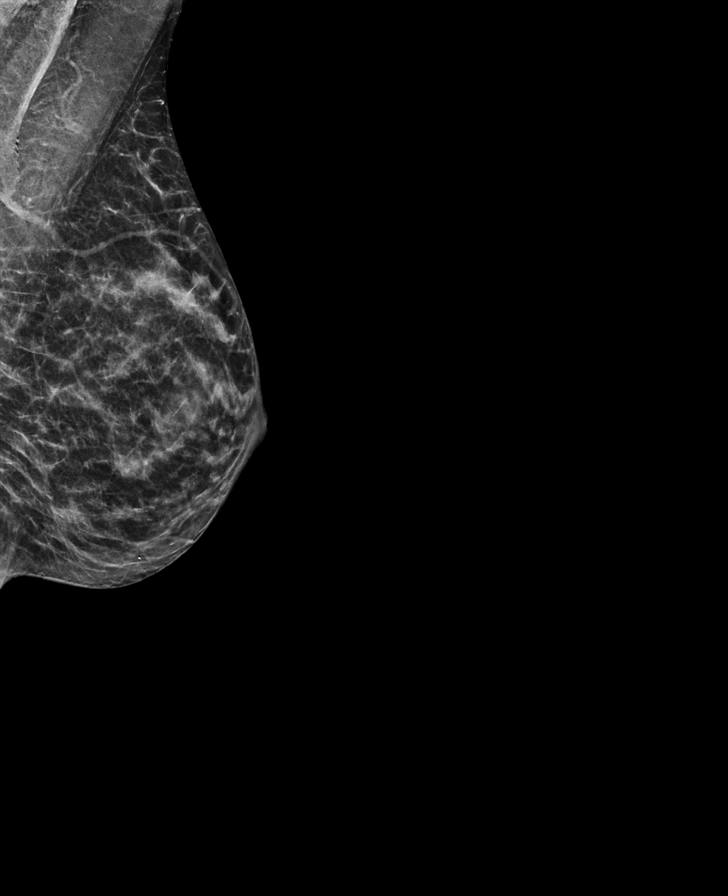

[R CC synth-2D]
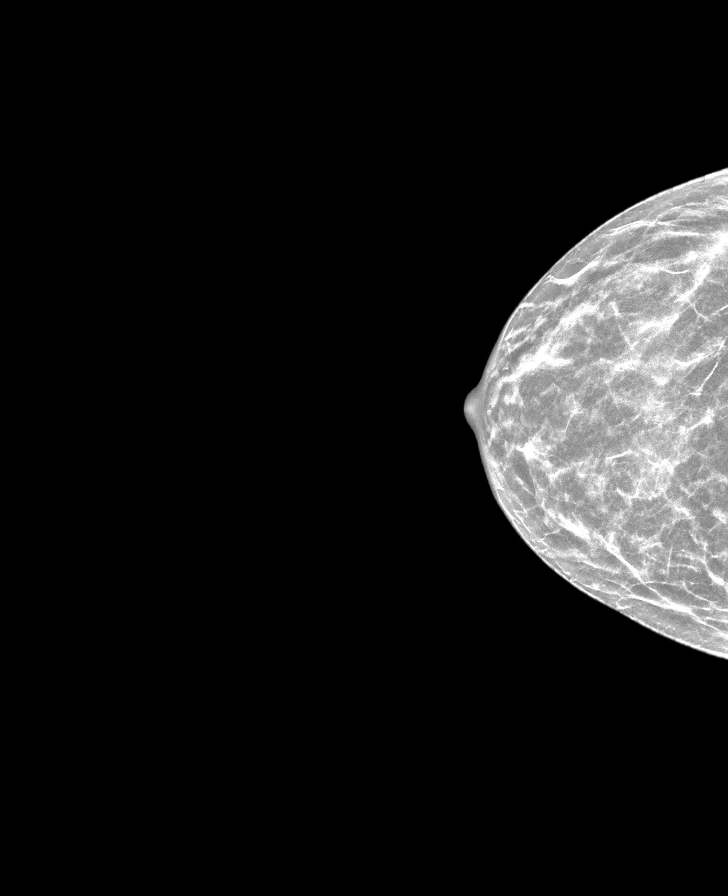

[R MLO synth-2D]
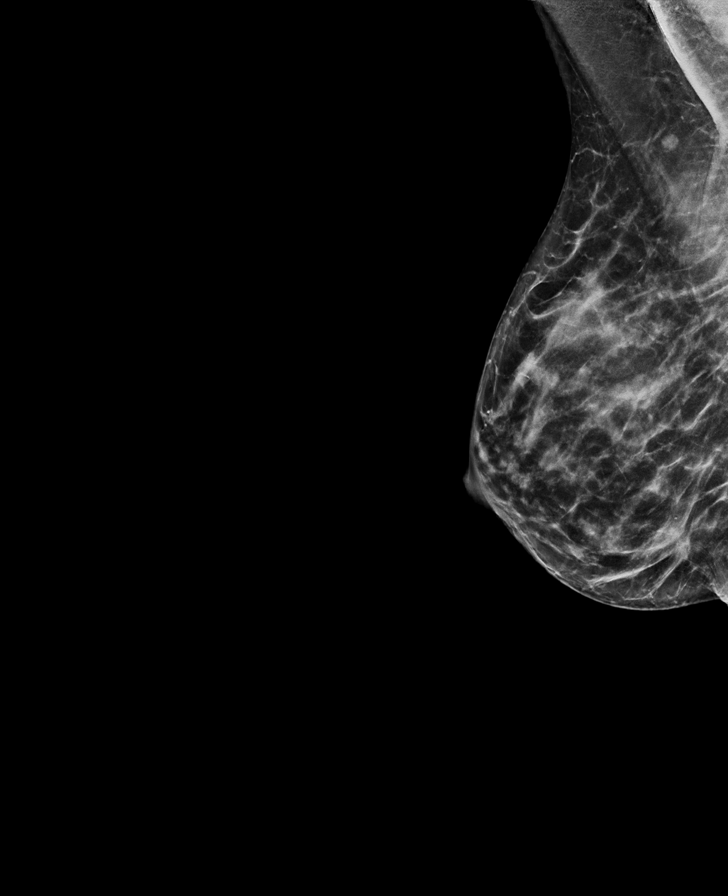

[L CC synth-2D]
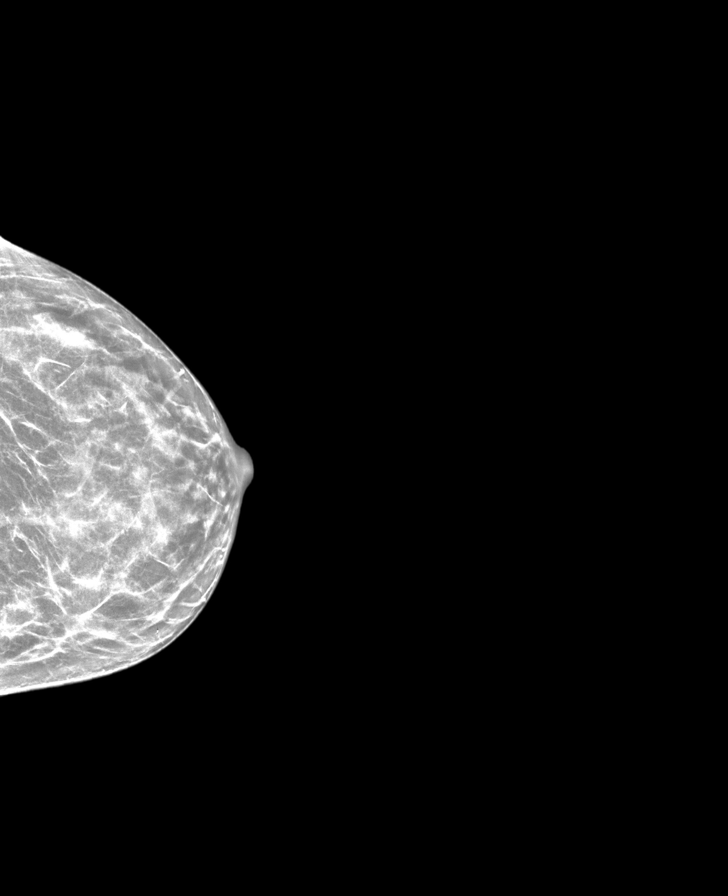

[R CC tomo · tomo slice 27/52.0]
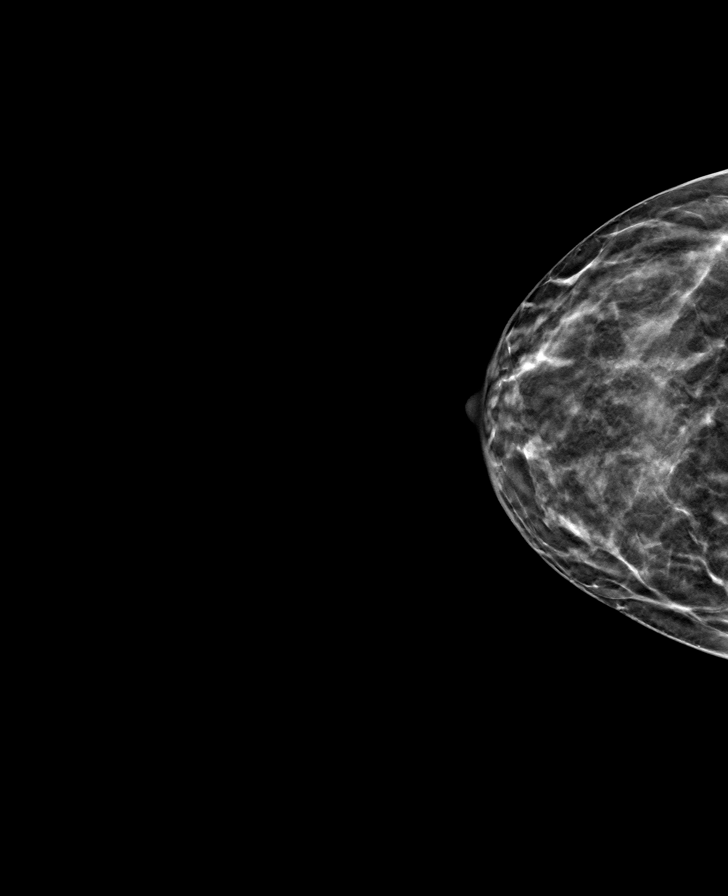

[L MLO tomo · tomo slice 25/49.0]
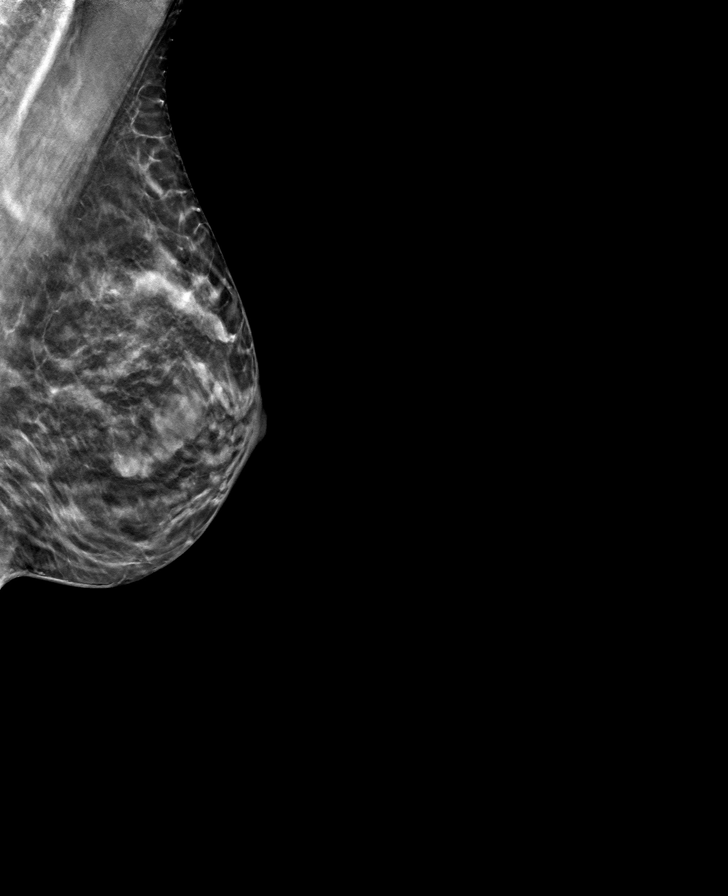

[L CC tomo · tomo slice 25/49.0]
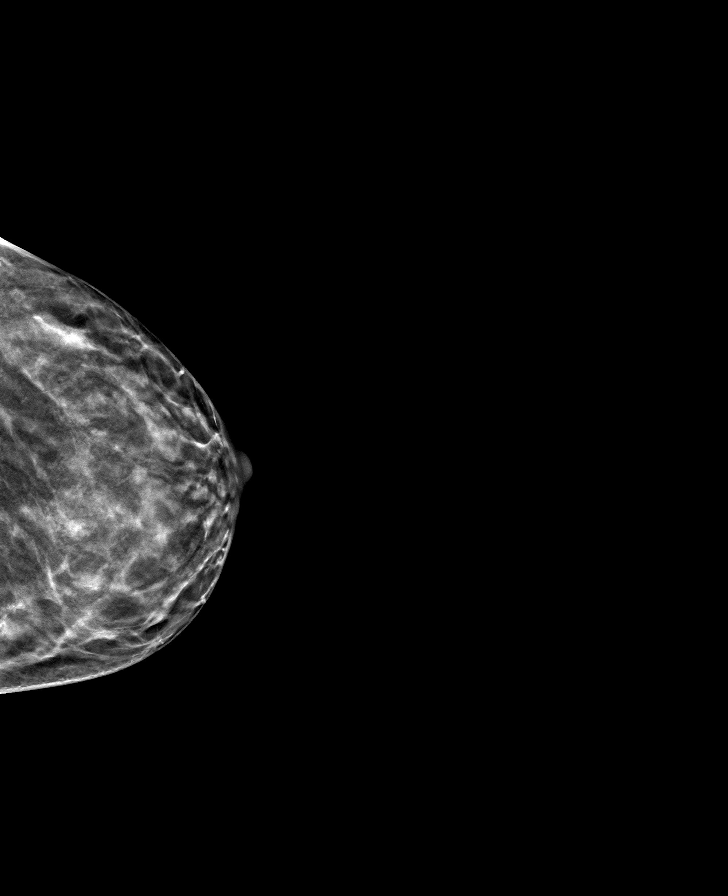

[R MLO tomo · tomo slice 29/58.0]
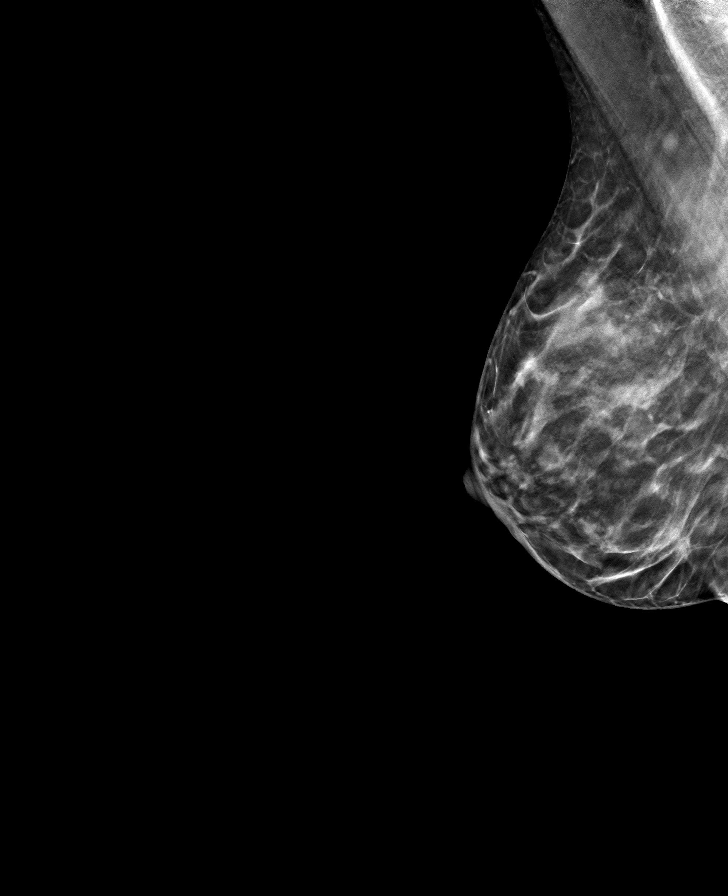

[8 of 24 positions shown; findings below may reference images not displayed]

ACR Breast Density Category c: The breast tissue is heterogeneously
dense, which may obscure small masses.
FINDINGS: There are no findings suspicious for malignancy. Images were
processed with CAD.
IMPRESSION: No mammographic evidence of malignancy. A result letter of this
screening mammogram will be mailed directly to the patient.

RECOMMENDATION:
Screening mammogram in one year. (Code:[id number])

BI-RADS CATEGORY  1: Negative.

## 2021-01-12 ENCOUNTER — Other Ambulatory Visit: Payer: Self-pay | Admitting: Physician Assistant

## 2021-01-12 DIAGNOSIS — Z1231 Encounter for screening mammogram for malignant neoplasm of breast: Secondary | ICD-10-CM

## 2021-03-01 ENCOUNTER — Ambulatory Visit (INDEPENDENT_AMBULATORY_CARE_PROVIDER_SITE_OTHER): Payer: BC Managed Care – PPO

## 2021-03-01 ENCOUNTER — Other Ambulatory Visit: Payer: Self-pay

## 2021-03-01 DIAGNOSIS — Z1231 Encounter for screening mammogram for malignant neoplasm of breast: Secondary | ICD-10-CM

## 2021-03-02 NOTE — Progress Notes (Signed)
Normal mammogram. Follow up one year.

## 2021-05-03 ENCOUNTER — Encounter: Payer: Self-pay | Admitting: Physician Assistant

## 2021-05-03 ENCOUNTER — Ambulatory Visit (INDEPENDENT_AMBULATORY_CARE_PROVIDER_SITE_OTHER): Payer: BC Managed Care – PPO | Admitting: Physician Assistant

## 2021-05-03 ENCOUNTER — Other Ambulatory Visit: Payer: Self-pay

## 2021-05-03 VITALS — BP 132/78 | HR 99 | Temp 98.4°F | Wt 152.1 lb

## 2021-05-03 DIAGNOSIS — Z13220 Encounter for screening for lipoid disorders: Secondary | ICD-10-CM | POA: Diagnosis not present

## 2021-05-03 DIAGNOSIS — Z0000 Encounter for general adult medical examination without abnormal findings: Secondary | ICD-10-CM

## 2021-05-03 DIAGNOSIS — Z23 Encounter for immunization: Secondary | ICD-10-CM | POA: Diagnosis not present

## 2021-05-03 DIAGNOSIS — Z1329 Encounter for screening for other suspected endocrine disorder: Secondary | ICD-10-CM

## 2021-05-03 DIAGNOSIS — I471 Supraventricular tachycardia: Secondary | ICD-10-CM

## 2021-05-03 DIAGNOSIS — Z131 Encounter for screening for diabetes mellitus: Secondary | ICD-10-CM

## 2021-05-03 MED ORDER — METOPROLOL SUCCINATE ER 25 MG PO TB24: 25.0000 mg | ORAL_TABLET | Freq: Every day | ORAL | 3 refills | Status: DC

## 2021-05-03 NOTE — Patient Instructions (Signed)
Health Maintenance, Female Adopting a healthy lifestyle and getting preventive care are important in promoting health and wellness. Ask your health care provider about: The right schedule for you to have regular tests and exams. Things you can do on your own to prevent diseases and keep yourself healthy. What should I know about diet, weight, and exercise? Eat a healthy diet  Eat a diet that includes plenty of vegetables, fruits, low-fat dairy products, and lean protein. Do not eat a lot of foods that are high in solid fats, added sugars, or sodium. Maintain a healthy weight Body mass index (BMI) is used to identify weight problems. It estimates body fat based on height and weight. Your health care provider can help determine your BMI and help you achieve or maintain a healthy weight. Get regular exercise Get regular exercise. This is one of the most important things you can do for your health. Most adults should: Exercise for at least 150 minutes each week. The exercise should increase your heart rate and make you sweat (moderate-intensity exercise). Do strengthening exercises at least twice a week. This is in addition to the moderate-intensity exercise. Spend less time sitting. Even light physical activity can be beneficial. Watch cholesterol and blood lipids Have your blood tested for lipids and cholesterol at 56 years of age, then have this test every 5 years. Have your cholesterol levels checked more often if: Your lipid or cholesterol levels are high. You are older than 56 years of age. You are at high risk for heart disease. What should I know about cancer screening? Depending on your health history and family history, you may need to have cancer screening at various ages. This may include screening for: Breast cancer. Cervical cancer. Colorectal cancer. Skin cancer. Lung cancer. What should I know about heart disease, diabetes, and high blood pressure? Blood pressure and heart  disease High blood pressure causes heart disease and increases the risk of stroke. This is more likely to develop in people who have high blood pressure readings, are of African descent, or are overweight. Have your blood pressure checked: Every 3-5 years if you are 18-39 years of age. Every year if you are 40 years old or older. Diabetes Have regular diabetes screenings. This checks your fasting blood sugar level. Have the screening done: Once every three years after age 40 if you are at a normal weight and have a low risk for diabetes. More often and at a younger age if you are overweight or have a high risk for diabetes. What should I know about preventing infection? Hepatitis B If you have a higher risk for hepatitis B, you should be screened for this virus. Talk with your health care provider to find out if you are at risk for hepatitis B infection. Hepatitis C Testing is recommended for: Everyone born from 1945 through 1965. Anyone with known risk factors for hepatitis C. Sexually transmitted infections (STIs) Get screened for STIs, including gonorrhea and chlamydia, if: You are sexually active and are younger than 56 years of age. You are older than 56 years of age and your health care provider tells you that you are at risk for this type of infection. Your sexual activity has changed since you were last screened, and you are at increased risk for chlamydia or gonorrhea. Ask your health care provider if you are at risk. Ask your health care provider about whether you are at high risk for HIV. Your health care provider may recommend a prescription medicine   to help prevent HIV infection. If you choose to take medicine to prevent HIV, you should first get tested for HIV. You should then be tested every 3 months for as long as you are taking the medicine. Pregnancy If you are about to stop having your period (premenopausal) and you may become pregnant, seek counseling before you get  pregnant. Take 400 to 800 micrograms (mcg) of folic acid every day if you become pregnant. Ask for birth control (contraception) if you want to prevent pregnancy. Osteoporosis and menopause Osteoporosis is a disease in which the bones lose minerals and strength with aging. This can result in bone fractures. If you are 65 years old or older, or if you are at risk for osteoporosis and fractures, ask your health care provider if you should: Be screened for bone loss. Take a calcium or vitamin D supplement to lower your risk of fractures. Be given hormone replacement therapy (HRT) to treat symptoms of menopause. Follow these instructions at home: Lifestyle Do not use any products that contain nicotine or tobacco, such as cigarettes, e-cigarettes, and chewing tobacco. If you need help quitting, ask your health care provider. Do not use street drugs. Do not share needles. Ask your health care provider for help if you need support or information about quitting drugs. Alcohol use Do not drink alcohol if: Your health care provider tells you not to drink. You are pregnant, may be pregnant, or are planning to become pregnant. If you drink alcohol: Limit how much you use to 0-1 drink a day. Limit intake if you are breastfeeding. Be aware of how much alcohol is in your drink. In the [place] one drink equals one 12 oz bottle of beer (355 mL), one 5 oz glass of wine (148 mL), or one 1 oz glass of hard liquor (44 mL). General instructions Schedule regular health, dental, and eye exams. Stay current with your vaccines. Tell your health care provider if: You often feel depressed. You have ever been abused or do not feel safe at home. Summary Adopting a healthy lifestyle and getting preventive care are important in promoting health and wellness. Follow your health care provider's instructions about healthy diet, exercising, and getting tested or screened for diseases. Follow your health care provider's  instructions on monitoring your cholesterol and blood pressure. This information is not intended to replace advice given to you by your health care provider. Make sure you discuss any questions you have with your health care provider. Document Revised: 09/09/2020 Document Reviewed: 06/25/2018 Elsevier Patient Education  2022 Elsevier Inc.  

## 2021-05-03 NOTE — Progress Notes (Signed)
Subjective:     [name] is a 55 y.o. female and is here for a comprehensive physical exam. The patient reports no problems.   Social History   Socioeconomic History   Marital status: Married    Spouse name: Not on file   Number of children: Not on file   Years of education: Not on file   Highest education level: Not on file  Occupational History   Occupation: academic Scientist, [employer] [place] Use   Smoking status: Never   Smokeless tobacco: Never  Substance and Sexual Activity   Alcohol use: Yes    Comment: occasionally   Drug use: No   Sexual activity: Yes    Partners: Male    Birth control/protection: Surgical, None  Other Topics Concern   Not on file  Social History Narrative   Not on file   Social Determinants of Health   Financial Resource Strain: Not on file  Food Insecurity: Not on file  Transportation Needs: Not on file  Physical Activity: Not on file  Stress: Not on file  Social Connections: Not on file  Intimate Partner Violence: Not on file   Health Maintenance  Topic Date Due   DEXA SCAN  04/09/2020   COVID-19 Vaccine (5 - Booster for Pfizer series) 02/04/2021   MAMMOGRAM  03/01/2022   PAP SMEAR-Modifier  09/15/2023   TETANUS/TDAP  02/10/2024   COLONOSCOPY (Pts 45-29yrs Insurance coverage will need to be confirmed)  11/13/2024   Pneumococcal Vaccine 44-59 Years old (3 - PPSV23 if available, else PCV20) 11/02/2029   INFLUENZA VACCINE  Completed   Hepatitis C Screening  Completed   HIV Screening  Completed   Zoster Vaccines- Shingrix  Completed   HPV VACCINES  Aged Out    The following portions of the patient's history were reviewed and updated as appropriate: allergies, current medications, past family history, past medical history, past social history, past surgical history, and problem list.  Review of Systems A comprehensive review of systems was negative.   Objective:    BP 132/78 (BP Location: Left Arm, Patient  Position: Sitting, Cuff Size: Normal)   Pulse 99   Temp 98.4 F (36.9 C) (Oral)   Wt 152 lb 1.9 oz (69 kg)   LMP 08/16/2018   SpO2 100%   BMI 23.13 kg/m  General appearance: alert, cooperative, and appears stated age Head: Normocephalic, without obvious abnormality, atraumatic Eyes: conjunctivae/corneas clear. PERRL, EOM's intact. Fundi benign. Ears: normal TM's and external ear canals both ears Nose: Nares normal. Septum midline. Mucosa normal. No drainage or sinus tenderness. Throat: lips, mucosa, and tongue normal; teeth and gums normal Neck: no adenopathy, no carotid bruit, no JVD, supple, symmetrical, trachea midline, and thyroid not enlarged, symmetric, no tenderness/mass/nodules Back: symmetric, no curvature. ROM normal. No CVA tenderness. Lungs: clear to auscultation bilaterally Heart: regular rate and rhythm, S1, S2 normal, no murmur, click, rub or gallop Abdomen: soft, non-tender; bowel sounds normal; no masses,  no organomegaly Extremities: extremities normal, atraumatic, no cyanosis or edema Pulses: 2+ and symmetric Skin: Skin color, texture, turgor normal. No rashes or lesions Lymph nodes: Cervical, supraclavicular, and axillary nodes normal. Neurologic: Alert and oriented X 3, normal strength and tone. Normal symmetric reflexes. Normal coordination and gait   .Marland Kitchen Depression screen [facility] 2/9 05/03/2021 04/13/2020 04/09/2019 04/07/2018 04/07/2017  Decreased Interest 0 0 0 0 0  Down, Depressed, Hopeless 0 0 0 0 0  PHQ - 2 Score 0 0 0 0 0  Altered  sleeping - - 0 0 -  Tired, decreased energy - - 0 0 -  Change in appetite - - 0 0 -  Feeling bad or failure about yourself  - - 0 0 -  Trouble concentrating - - 0 0 -  Moving slowly or fidgety/restless - - 0 0 -  Suicidal thoughts - - 0 0 -  PHQ-9 Score - - 0 0 -  Difficult doing work/chores - - Not difficult at all Not difficult at all -    Assessment:    Healthy female exam.      Plan:  Marland KitchenMarland KitchenYanet was seen today for annual  exam.  Diagnoses and all orders for this visit:  Routine physical examination -     Lipid Panel w/reflex Direct LDL -     COMPLETE METABOLIC PANEL WITH GFR -     TSH -     CBC with Differential/Platelet -     VITAMIN D 25 Hydroxy (Vit-D Deficiency, Fractures)  Need for influenza vaccination -     Flu Vaccine QUAD 6+ mos PF IM (Fluarix Quad PF)  Paroxysmal supraventricular tachycardia (HCC) -     metoprolol succinate (TOPROL-XL) 25 MG 24 hr tablet; Take 1 tablet (25 mg total) by mouth daily.  Screening for lipid disorders -     Lipid Panel w/reflex Direct LDL  Screening for diabetes mellitus -     COMPLETE METABOLIC PANEL WITH GFR  Thyroid disorder screen -     TSH  .Marland Kitchen Discussed 150 minutes of exercise a week.  Encouraged vitamin D 1000 units and Calcium 1300mg  or 4 servings of dairy a day.  PHQ no concerns.  Fasting labs ordered.  Mammogram UTD.  Needs bone density next year due to being high risk.  Pap UTD.  Colonoscopy at [facility]. Will call for records and UTD per patient.  Covid vaccine x4.  Flu shot given today.   Metoprolol refilled for 1 year.   Asthma controlled and managed by pulmonology.    See After Visit Summary for Counseling Recommendations

## 2021-05-04 LAB — LIPID PANEL W/REFLEX DIRECT LDL
Cholesterol: 222 mg/dL — ABNORMAL HIGH (ref <200)
HDL: 80 mg/dL (ref >=50)
LDL Cholesterol (Calc): 126 mg/dL (calc) — ABNORMAL HIGH
Non-HDL Cholesterol (Calc): 142 mg/dL (calc) — ABNORMAL HIGH (ref <130)
Total CHOL/HDL Ratio: 2.8 (calc) (ref <5.0)
Triglycerides: 68 mg/dL (ref <150)

## 2021-05-04 LAB — CBC WITH DIFFERENTIAL/PLATELET
Absolute Monocytes: 293 cells/uL (ref 200–950)
Basophils Absolute: 9 cells/uL (ref 0–200)
Basophils Relative: 0.2 %
Eosinophils Absolute: 0 cells/uL — ABNORMAL LOW (ref 15–500)
Eosinophils Relative: 0 %
HCT: 42.5 % (ref 35.0–45.0)
Hemoglobin: 13.6 g/dL (ref 11.7–15.5)
Lymphs Abs: 2169 cells/uL (ref 850–3900)
MCH: 28.3 pg (ref 27.0–33.0)
MCHC: 32 g/dL (ref 32.0–36.0)
MCV: 88.4 fL (ref 80.0–100.0)
MPV: 11.4 fL (ref 7.5–12.5)
Monocytes Relative: 6.5 %
Neutro Abs: 2030 cells/uL (ref 1500–7800)
Neutrophils Relative %: 45.1 %
Platelets: 244 10*3/uL (ref 140–400)
RBC: 4.81 10*6/uL (ref 3.80–5.10)
RDW: 12.4 % (ref 11.0–15.0)
Total Lymphocyte: 48.2 %
WBC: 4.5 10*3/uL (ref 3.8–10.8)

## 2021-05-04 LAB — COMPLETE METABOLIC PANEL WITH GFR
AG Ratio: 1.6 (calc) (ref 1.0–2.5)
ALT: 22 U/L (ref 6–29)
AST: 19 U/L (ref 10–35)
Albumin: 4 g/dL (ref 3.6–5.1)
Alkaline phosphatase (APISO): 55 U/L (ref 37–153)
BUN: 13 mg/dL (ref 7–25)
CO2: 31 mmol/L (ref 20–32)
Calcium: 9.7 mg/dL (ref 8.6–10.4)
Chloride: 105 mmol/L (ref 98–110)
Creat: 0.76 mg/dL (ref 0.50–1.03)
Globulin: 2.5 g/dL (calc) (ref 1.9–3.7)
Glucose, Bld: 90 mg/dL (ref 65–99)
Potassium: 4.6 mmol/L (ref 3.5–5.3)
Sodium: 142 mmol/L (ref 135–146)
Total Bilirubin: 0.4 mg/dL (ref 0.2–1.2)
Total Protein: 6.5 g/dL (ref 6.1–8.1)
eGFR: 92 mL/min/{1.73_m2} (ref >=60)

## 2021-05-04 LAB — TSH: TSH: 1.76 mIU/L (ref 0.40–4.50)

## 2021-05-04 LAB — VITAMIN D 25 HYDROXY (VIT D DEFICIENCY, FRACTURES): Vit D, 25-Hydroxy: 35 ng/mL (ref 30–100)

## 2021-05-04 NOTE — Progress Notes (Signed)
Oneita,   HDL, good cholesterol, looks GREAT.  LDL, bad cholesterol, looks so much better than last year. Keep up the work.  Kidney, liver, glucose look great.  Thyroid looks perfect.  Vitamin D is normal range but could be higher. Make sure taking at least 2000 units of vitamin D3 and with protein/fat to help with better absorption.

## 2021-05-05 ENCOUNTER — Encounter: Payer: Self-pay | Admitting: Physician Assistant

## 2021-10-24 ENCOUNTER — Encounter: Payer: Self-pay | Admitting: Family Medicine

## 2021-10-24 ENCOUNTER — Ambulatory Visit: Payer: BC Managed Care – PPO | Admitting: Family Medicine

## 2021-10-24 VITALS — BP 120/73 | HR 81 | Resp 16 | Ht 68.0 in | Wt 155.0 lb

## 2021-10-24 DIAGNOSIS — L989 Disorder of the skin and subcutaneous tissue, unspecified: Secondary | ICD-10-CM

## 2021-10-24 DIAGNOSIS — R21 Rash and other nonspecific skin eruption: Secondary | ICD-10-CM | POA: Diagnosis not present

## 2021-10-24 MED ORDER — CLOTRIMAZOLE-BETAMETHASONE 1-0.05 % EX CREA: 1.0000 "application " | TOPICAL_CREAM | Freq: Two times a day (BID) | CUTANEOUS | 1 refills | Status: DC

## 2021-10-24 NOTE — Progress Notes (Signed)
? ?Established Patient Office Visit ? ?Subjective:  ?Patient ID: [name], female    DOB: [birth year]  Age: 56 y.o. MRN: [id number] ? ?CC:  ?Chief Complaint  ?Patient presents with  ? Nevus  ?  On nose in 01/2021. No change in color or size.   ? Eczema  ?  Groin area and under breast. 6 months.   ? ? ?HPI ?[name] presents for  ? ?Moles on her nose since last summer. No recent changes.  She says its not painful or itchy or bleeding.  She has never had any prior history of skin cancer she has no other lesions similar to this. ? ?Rash - Groin area and under breast. 6 months.  Also in the groin crease area.  She says she has a history of eczema and it feels similar and looks similar normally though she gets it in her antecubital fossa.  It has been a little itchy she has been using her daughter's cream which she has been prescribed for eczema as well and says it helps some but it just does not completely seem to go away.  He does exercise regularly. ? ?Past Medical History:  ?Diagnosis Date  ? Asthma   ? Esophageal stricture 12/16/2012  ? Dilated Spring 2014   ? Irregular menstrual bleeding 02/27/2011  ? Tachycardia   ? ? ?Past Surgical History:  ?Procedure Laterality Date  ? CESAREAN SECTION    ? x 2 last in 99  ? NASAL POLYP SURGERY    ? TUBAL LIGATION  2000  ? ? ?Family History  ?Problem Relation Age of Onset  ? Diabetes Mother   ? Hypertension Mother   ? ? ?Social History  ? ?Socioeconomic History  ? Marital status: Married  ?  Spouse name: Not on file  ? Number of children: Not on file  ? Years of education: Not on file  ? Highest education level: Not on file  ?Occupational History  ? Occupation: Librarian, academic of [employer]  ?Tobacco Use  ? Smoking status: Never  ? Smokeless tobacco: Never  ?Substance and Sexual Activity  ? Alcohol use: Yes  ?  Comment: occasionally  ? Drug use: No  ? Sexual activity: Yes  ?  Partners: Male  ?  Birth control/protection: Surgical, None  ?Other Topics Concern  ?  Not on file  ?Social History Narrative  ? Not on file  ? ?Social Determinants of Health  ? ?Financial Resource Strain: Not on file  ?Food Insecurity: Not on file  ?Transportation Needs: Not on file  ?Physical Activity: Not on file  ?Stress: Not on file  ?Social Connections: Not on file  ?Intimate Partner Violence: Not on file  ? ? ?Outpatient Medications Prior to Visit  ?Medication Sig Dispense Refill  ? Benralizumab (FASENRA PEN) 30 MG/ML SOAJ every 8 (eight) weeks.    ? budesonide (PULMICORT) 0.5 MG/2ML nebulizer solution Take by nebulization.    ? budesonide-formoterol (SYMBICORT) 160-4.5 MCG/ACT inhaler Inhale into the lungs.    ? fluticasone (FLONASE) 50 MCG/ACT nasal spray Place 2 sprays into both nostrils daily. 16 g 6  ? metoprolol succinate (TOPROL-XL) 25 MG 24 hr tablet Take 1 tablet (25 mg total) by mouth daily. 90 tablet 3  ? BUDESONIDE NA Place into the nose.    ? EPINEPHrine 0.3 mg/0.3 mL IJ SOAJ injection Inject into the muscle. (Patient not taking: Reported on 09/14/2020)    ? Multiple Vitamin (MULTIVITAMIN) tablet Take 1 tablet by mouth daily.    ?  Benralizumab 30 MG/ML SOSY Inject into the skin.    ? ?No facility-administered medications prior to visit.  ? ? ?Allergies  ?Allergen Reactions  ? Amoxicillin Other (See Comments)  ?  "It just doesn't work, it sits in my body and doesn't do what it needs to do." ?"just sits in my body & doesn't do anything"  ? ? ?ROS ?Review of Systems ? ?  ?Objective:  ?  ?Physical Exam ?Vitals reviewed.  ?Constitutional:   ?   Appearance: She is well-developed.  ?HENT:  ?   Head: Normocephalic and atraumatic.  ?Eyes:  ?   Conjunctiva/sclera: Conjunctivae normal.  ?Cardiovascular:  ?   Rate and Rhythm: Normal rate.  ?Pulmonary:  ?   Effort: Pulmonary effort is normal.  ?Skin: ?   General: Skin is dry.  ?   Comments: Near the tip of her nose she has a very small approximately 1 to 2 mm hyperpigmented lesion that looks like it is under the surface of the skin it does not  blanch with pressure.  No scaling.  Underneath her right breast and in the groin creases she has what looks like a silver scaled confluent rash that goes along the crease.  No scattered satellite lesions.  ?Neurological:  ?   Mental Status: She is alert and oriented to person, place, and time.  ?Psychiatric:     ?   Behavior: Behavior normal.  ? ? ?BP 120/73   Pulse 81   Resp 16   Ht 5' 8"  (1.727 m)   Wt 155 lb (70.3 kg)   LMP 08/16/2018   SpO2 99%   BMI 23.57 kg/m?  ?Wt Readings from Last 3 Encounters:  ?10/24/21 155 lb (70.3 kg)  ?05/03/21 152 lb 1.9 oz (69 kg)  ?09/14/20 150 lb (68 kg)  ? ? ? ?Health Maintenance Due  ?Topic Date Due  ? DEXA SCAN  04/09/2020  ? ? ?There are no preventive care reminders to display for this patient. ? ?Lab Results  ?Component Value Date  ? TSH 1.76 05/03/2021  ? ?Lab Results  ?Component Value Date  ? WBC 4.5 05/03/2021  ? HGB 13.6 05/03/2021  ? HCT 42.5 05/03/2021  ? MCV 88.4 05/03/2021  ? PLT 244 05/03/2021  ? ?Lab Results  ?Component Value Date  ? NA 142 05/03/2021  ? K 4.6 05/03/2021  ? CO2 31 05/03/2021  ? GLUCOSE 90 05/03/2021  ? BUN 13 05/03/2021  ? CREATININE 0.76 05/03/2021  ? BILITOT 0.4 05/03/2021  ? ALKPHOS 54 02/23/2017  ? AST 19 05/03/2021  ? ALT 22 05/03/2021  ? PROT 6.5 05/03/2021  ? ALBUMIN 4.3 02/23/2017  ? CALCIUM 9.7 05/03/2021  ? EGFR 92 05/03/2021  ? ?Lab Results  ?Component Value Date  ? CHOL 222 (H) 05/03/2021  ? ?Lab Results  ?Component Value Date  ? HDL 80 05/03/2021  ? ?Lab Results  ?Component Value Date  ? LDLCALC 126 (H) 05/03/2021  ? ?Lab Results  ?Component Value Date  ? TRIG 68 05/03/2021  ? ?Lab Results  ?Component Value Date  ? CHOLHDL 2.8 05/03/2021  ? ?Lab Results  ?Component Value Date  ? HGBA1C 5.6 03/28/2016  ? ? ?  ?Assessment & Plan:  ? ?Problem List Items Addressed This Visit   ?None ?Visit Diagnoses   ? ? Lesion of skin of nose    -  Primary  ? Relevant Orders  ? Ambulatory referral to Dermatology  ? Rash of groin      ?  Relevant  Medications  ? clotrimazole-betamethasone (LOTRISONE) cream  ? ?  ? ?Rash in the groin creases under the pannus crease and underneath her right breast-consider eczema versus candidal infection.  We will treat with Lotrisone cream.  If not improved after 2 weeks then please give [place] a call back. ? ?Lesion on skin of nose-unclear etiology.  It looks like it is actually underneath the skin layer.  Its not on the surface.  I would go ahead and refer to dermatology for further evaluation. ? ?Meds ordered this encounter  ?Medications  ? clotrimazole-betamethasone (LOTRISONE) cream  ?  Sig: Apply 1 application. topically 2 (two) times daily.  ?  Dispense:  45 g  ?  Refill:  1  ? ? ?Follow-up: Return if symptoms worsen or fail to improve.  ? ? ?Beatrice Lecher, MD ?

## 2022-01-18 ENCOUNTER — Other Ambulatory Visit: Payer: Self-pay | Admitting: Physician Assistant

## 2022-01-18 DIAGNOSIS — Z1231 Encounter for screening mammogram for malignant neoplasm of breast: Secondary | ICD-10-CM

## 2022-03-08 ENCOUNTER — Ambulatory Visit (INDEPENDENT_AMBULATORY_CARE_PROVIDER_SITE_OTHER): Payer: BC Managed Care – PPO

## 2022-03-08 DIAGNOSIS — Z1231 Encounter for screening mammogram for malignant neoplasm of breast: Secondary | ICD-10-CM

## 2022-03-12 NOTE — Progress Notes (Signed)
Normal mammogram. Follow up in 1 year.

## 2022-04-11 ENCOUNTER — Ambulatory Visit
Admission: EM | Admit: 2022-04-11 | Discharge: 2022-04-11 | Disposition: A | Discharge status: Home / Self Care | Payer: BC Managed Care – PPO

## 2022-04-11 DIAGNOSIS — J4541 Moderate persistent asthma with (acute) exacerbation: Secondary | ICD-10-CM | POA: Diagnosis not present

## 2022-04-11 DIAGNOSIS — J40 Bronchitis, not specified as acute or chronic: Secondary | ICD-10-CM | POA: Diagnosis not present

## 2022-04-11 DIAGNOSIS — J329 Chronic sinusitis, unspecified: Secondary | ICD-10-CM | POA: Diagnosis not present

## 2022-04-11 MED ORDER — DOXYCYCLINE HYCLATE 100 MG PO CAPS: 100.0000 mg | ORAL_CAPSULE | Freq: Two times a day (BID) | ORAL | 0 refills | Status: DC

## 2022-04-11 MED ORDER — PREDNISONE 10 MG (21) PO TBPK
ORAL_TABLET | ORAL | 0 refills | Status: DC
Start: 2022-04-11 — End: 2022-05-08

## 2022-04-11 NOTE — ED Provider Notes (Signed)
[name]    CSN: [id number] Arrival date & time: 04/11/22  1711      History   Chief Complaint Chief Complaint  Patient presents with   Cough   Nasal Congestion    HPI [name] is a 56 y.o. female.   Patient presents today with a weeklong history of URI symptoms.  Reports nasal congestion, sinus pressure, cough.  Reports symptoms initially began to improve but have recently worsened over the past several days prompting reevaluation.  Denies any chest pain, shortness of breath, fever, nausea, vomiting.  She has been taking Sudafed and other over-the-counter medications without improvement of symptoms.  She has a history of asthma managed with Symbicort, Spiriva, Fasenra and uses albuterol for rescue.  Reports that she has not required increased use of albuterol but has felt more cough/chest tightness.  Last steroid use was approximately 6 months ago.  Denies any recent antibiotics.  Denies any known sick contacts.    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   Bruises easily 04/07/2017   Temporal pain 04/07/2017   Teeth grinding 11/16/2016   Tingling of face 11/16/2016   Left breast mass 06/04/2016   Nasal polyposis 03/25/2014   Asthma, chronic 02/09/2014   Constipation 09/21/2013   Allergic rhinitis 09/21/2013   Paroxysmal supraventricular tachycardia ([place]) 01/28/2013   Anemia 12/22/2012   Esophageal stricture 12/16/2012   Irregular menstrual bleeding 02/27/2011    Past Surgical History:  Procedure Laterality Date   CESAREAN SECTION     x 2 last in 78   NASAL POLYP SURGERY     TUBAL LIGATION   2000    OB History     Gravida  2   Para  2   Term      Preterm      AB      Living  2      SAB      IAB      Ectopic      Multiple      Live Births               Home Medications    Prior to Admission medications   Medication Sig Start Date End Date Taking? Authorizing Provider  doxycycline (VIBRAMYCIN) 100 MG capsule Take 1 capsule (100 mg total) by mouth 2 (two) times daily. 04/11/22  Yes Kimbrely Buckel K, PA-C  predniSONE (STERAPRED UNI-PAK 21 TAB) 10 MG (21) TBPK tablet As directed 04/11/22  Yes Carren Blakley K, PA-C  pseudoephedrine (SUDAFED) 30 MG tablet Take 30 mg by mouth every 4 (four) hours as needed for congestion.   Yes [provider]  Benralizumab (FASENRA PEN) 30 MG/ML SOAJ every 8 (eight) weeks. 08/02/21   [provider]  budesonide (PULMICORT) 0.5 MG/2ML nebulizer solution Take by nebulization. 10/17/21   [provider]  budesonide-formoterol (SYMBICORT) 160-4.5 MCG/ACT inhaler Inhale into the lungs. 07/19/16   [provider]  metoprolol succinate (TOPROL-XL) 25 MG 24 hr tablet Take 1 tablet (25 mg total) by mouth daily. 05/03/21   Breeback, Royetta Car, PA-C  Multiple Vitamin (MULTIVITAMIN) tablet Take 1 tablet  by mouth daily.    [provider]    Family History Family History  Problem Relation Age of Onset   Diabetes Mother    Hypertension Mother    Dementia Mother     Social History Social History   Tobacco Use   Smoking status: Never   Smokeless tobacco: Never  Vaping Use   Vaping Use: Never used  Substance Use Topics   Alcohol use: Yes    Comment: occasionally   Drug use: No     Allergies   Amoxicillin   Review of Systems Review of Systems  Constitutional:  Positive for activity change. Negative for appetite change, fatigue and fever.  HENT:  Positive for congestion, postnasal drip and sinus pressure. Negative for sneezing and sore throat.   Respiratory:  Positive for cough and  wheezing. Negative for chest tightness and shortness of breath.   Cardiovascular:  Negative for chest pain.  Gastrointestinal:  Negative for abdominal pain, diarrhea, nausea and vomiting.     Physical Exam Triage Vital Signs ED Triage Vitals  Enc Vitals Group     BP 04/11/22 1726 136/88     Pulse Rate 04/11/22 1726 87     Resp 04/11/22 1726 20     Temp 04/11/22 1726 98.5 F (36.9 C)     Temp Source 04/11/22 1726 Oral     SpO2 04/11/22 1726 100 %     Weight 04/11/22 1720 152 lb (68.9 kg)     Height 04/11/22 1720 5' 7.5" (1.715 m)     Head Circumference --      Peak Flow --      Pain Score 04/11/22 1720 0     Pain Loc --      Pain Edu? --      Excl. in [place]? --    No data found.  Updated Vital Signs BP 136/88 (BP Location: Right Arm)   Pulse 87   Temp 98.5 F (36.9 C) (Oral)   Resp 20   Ht 5' 7.5" (1.715 m)   Wt 152 lb (68.9 kg)   LMP 08/16/2018   SpO2 100%   BMI 23.46 kg/m   Visual Acuity Right Eye Distance:   Left Eye Distance:   Bilateral Distance:    Right Eye Near:   Left Eye Near:    Bilateral Near:     Physical Exam Vitals reviewed.  Constitutional:      General: She is awake. She is not in acute distress.    Appearance: Normal appearance. She is well-developed. She is not ill-appearing.     Comments: Very pleasant female appears stated age in no acute distress sitting comfortably in exam room  HENT:     Head: Normocephalic and atraumatic.     Right Ear: Tympanic membrane, ear canal and external ear normal. Tympanic membrane is not erythematous or bulging.     Left Ear: Tympanic membrane, ear canal and external ear normal. Tympanic membrane is not erythematous or bulging.     Nose:     Right Sinus: Maxillary sinus tenderness present. No frontal sinus tenderness.     Left Sinus: Maxillary sinus tenderness present. No frontal sinus tenderness.     Mouth/Throat:     Pharynx: Uvula midline. Posterior oropharyngeal erythema present. No oropharyngeal  exudate.     Comments: Erythema and drainage in posterior oropharynx Cardiovascular:     Rate and Rhythm: Normal rate and regular rhythm.     Heart sounds: Normal heart sounds, S1 normal  and S2 normal. No murmur heard. Pulmonary:     Effort: Pulmonary effort is normal.     Breath sounds: Wheezing and rhonchi present. No rales.     Comments: Scattered wheezing and rhonchi that clear with cough Psychiatric:        Behavior: Behavior is cooperative.      UC Treatments / Results  Labs (all labs ordered are listed, but only abnormal results are displayed) Labs Reviewed - No data to display  EKG   Radiology No results found.  Procedures Procedures (including critical care time)  Medications Ordered in UC Medications - No data to display  Initial Impression / Assessment and Plan / UC Course  I have reviewed the triage vital signs and the nursing notes.  Pertinent labs & imaging results that were available during my care of the patient were reviewed by me and considered in my medical decision making (see chart for details).     Patient is well-appearing, afebrile, nontoxic, nontachycardic with oxygen saturation of 100%.  No indication for viral testing as patient has been symptomatic for a week and this would not change management.  Given prolonged and recent double worsening of symptoms will cover for secondary bacterial infection.  She was started on doxycycline with instruction not to be in the sun for long periods of time with this medication due to photosensitivity.  We will also start prednisone taper given concern for asthma flare with wheezing and rhonchi on exam.  She was instructed not to take NSAIDs with this medication due to risk of GI bleeding.  Can use over-the-counter medications including Tylenol, Mucinex, Flonase.  She is to rest and drink plenty of fluid.  Recommend close follow-up with her primary care.  Discussed that if she has any worsening symptoms including  fever, chest pain, shortness of breath, worsening cough, weakness, nausea/vomiting interfere with oral intake she needs to be seen immediately.  Strict return precautions given.  Work excuse note provided.  Final Clinical Impressions(s) / UC Diagnoses   Final diagnoses:  Sinobronchitis  Moderate persistent asthma with acute exacerbation     Discharge Instructions      Start doxycycline as prescribed.  Stay out of the sun while on this medication.  Use prednisone taper.  Do not take NSAIDs with this medication including aspirin, ibuprofen/Advil, naproxen/Aleve.  Use Tylenol, Mucinex, Flonase for symptom relief.  Make sure you rest and drink plenty of fluid.  If your symptoms or not improving by next week return for reevaluation.  If anything worsens and you develop high fever, worsening cough, shortness of breath, nausea/vomiting you need to be seen immediately.     ED Prescriptions     Medication Sig Dispense Auth. Provider   predniSONE (STERAPRED UNI-PAK 21 TAB) 10 MG (21) TBPK tablet As directed 21 tablet Decorey Wahlert K, PA-C   doxycycline (VIBRAMYCIN) 100 MG capsule Take 1 capsule (100 mg total) by mouth 2 (two) times daily. 20 capsule Lear Carstens, Derry Skill, PA-C      PDMP not reviewed this encounter.   Terrilee Croak, PA-C 04/11/22 1739

## 2022-04-11 NOTE — ED Triage Notes (Signed)
Pt presents to Urgent Care with c/o nasal congestion and dry cough w/ pain to R upper chest (when coughing) x 6 days. Reports sore throat and HA at onset of symptoms. Has not done a COVID test.

## 2022-04-11 NOTE — Discharge Instructions (Signed)
Start doxycycline as prescribed.  Stay out of the sun while on this medication.  Use prednisone taper.  Do not take NSAIDs with this medication including aspirin, ibuprofen/Advil, naproxen/Aleve.  Use Tylenol, Mucinex, Flonase for symptom relief.  Make sure you rest and drink plenty of fluid.  If your symptoms or not improving by next week return for reevaluation.  If anything worsens and you develop high fever, worsening cough, shortness of breath, nausea/vomiting you need to be seen immediately.

## 2022-05-03 ENCOUNTER — Other Ambulatory Visit: Payer: Self-pay | Admitting: Physician Assistant

## 2022-05-03 DIAGNOSIS — I471 Supraventricular tachycardia, unspecified: Secondary | ICD-10-CM

## 2022-05-09 ENCOUNTER — Ambulatory Visit (INDEPENDENT_AMBULATORY_CARE_PROVIDER_SITE_OTHER): Payer: BC Managed Care – PPO | Admitting: Physician Assistant

## 2022-05-09 ENCOUNTER — Encounter: Payer: Self-pay | Admitting: Physician Assistant

## 2022-05-09 VITALS — BP 111/73 | HR 67 | Ht 67.5 in | Wt 151.0 lb

## 2022-05-09 DIAGNOSIS — J8283 Eosinophilic asthma: Secondary | ICD-10-CM

## 2022-05-09 DIAGNOSIS — Z0000 Encounter for general adult medical examination without abnormal findings: Secondary | ICD-10-CM

## 2022-05-09 DIAGNOSIS — Z13220 Encounter for screening for lipoid disorders: Secondary | ICD-10-CM

## 2022-05-09 DIAGNOSIS — Z1329 Encounter for screening for other suspected endocrine disorder: Secondary | ICD-10-CM | POA: Diagnosis not present

## 2022-05-09 DIAGNOSIS — Z780 Asymptomatic menopausal state: Secondary | ICD-10-CM

## 2022-05-09 DIAGNOSIS — Z131 Encounter for screening for diabetes mellitus: Secondary | ICD-10-CM | POA: Diagnosis not present

## 2022-05-09 DIAGNOSIS — E559 Vitamin D deficiency, unspecified: Secondary | ICD-10-CM

## 2022-05-09 DIAGNOSIS — I471 Supraventricular tachycardia, unspecified: Secondary | ICD-10-CM

## 2022-05-09 DIAGNOSIS — Z13820 Encounter for screening for osteoporosis: Secondary | ICD-10-CM

## 2022-05-09 NOTE — Progress Notes (Signed)
Complete physical exam  Patient: [name]   DOB: 1966/01/02   56 y.o. Female  MRN: [id number]  Subjective:    Chief Complaint  Patient presents with   Annual Exam    [name] is a 56 y.o. female who presents today for a complete physical exam. She reports consuming a general diet.  She is very active and walks 4-5 times a week.   She generally feels well. She reports sleeping well. She does not have additional problems to discuss today.    Most recent fall risk assessment:    05/09/2022    7:30 AM  Fall Risk   Falls in the past year? 0  Number falls in past yr: 0  Injury with Fall? 0  Risk for fall due to : No Fall Risks  Follow up Falls evaluation completed     Most recent depression screenings:    05/09/2022    7:31 AM 10/24/2021   11:16 AM  PHQ 2/9 Scores  PHQ - 2 Score 0 0    Vision:Within last year and Dental: No current dental problems and Receives regular dental care  Patient Active Problem List   Diagnosis Date Noted   Bleeding after intercourse 09/14/2020   Atrophic vaginitis 09/14/2020   Bilateral lower extremity edema 10/23/2019   Hyperlipidemia 93/81/8299   Eosinophilic asthma 37/16/9678   Bruises easily 04/07/2017   Temporal pain 04/07/2017   Teeth grinding 11/16/2016   Tingling of face 11/16/2016   Left breast mass 06/04/2016   Nasal polyposis 03/25/2014   Asthma, chronic 02/09/2014   Constipation 09/21/2013   Allergic rhinitis 09/21/2013   Paroxysmal supraventricular tachycardia 01/28/2013   Anemia 12/22/2012   Esophageal stricture 12/16/2012   Irregular menstrual bleeding 02/27/2011   Past Medical History:  Diagnosis Date   Asthma    Esophageal stricture 12/16/2012   Dilated Spring 2014    Irregular menstrual bleeding 02/27/2011   Tachycardia    Family History  Problem Relation Age of Onset   Diabetes Mother    Hypertension Mother    Dementia Mother    Allergies  Allergen Reactions   Amoxicillin Other (See Comments)     "It just doesn't work, it sits in my body and doesn't do what it needs to do." "just sits in my body & doesn't do anything"      Patient Care Team: Lavada Mesi as PCP - General (Family Medicine)   Outpatient Medications Prior to Visit  Medication Sig   Benralizumab (FASENRA PEN) 30 MG/ML SOAJ every 8 (eight) weeks.   budesonide (PULMICORT) 0.5 MG/2ML nebulizer solution Take by nebulization.   budesonide-formoterol (SYMBICORT) 160-4.5 MCG/ACT inhaler Inhale into the lungs.   metoprolol succinate (TOPROL-XL) 25 MG 24 hr tablet Take 1 tablet (25 mg total) by mouth daily. Appt for further refills   Multiple Vitamin (MULTIVITAMIN) tablet Take 1 tablet by mouth daily.   [DISCONTINUED] doxycycline (VIBRAMYCIN) 100 MG capsule Take 1 capsule (100 mg total) by mouth 2 (two) times daily.   [DISCONTINUED] predniSONE (STERAPRED UNI-PAK 21 TAB) 10 MG (21) TBPK tablet As directed   [DISCONTINUED] pseudoephedrine (SUDAFED) 30 MG tablet Take 30 mg by mouth every 4 (four) hours as needed for congestion.   No facility-administered medications prior to visit.    Review of Systems  All other systems reviewed and are negative.         Objective:     BP 111/73   Pulse 67   Ht 5' 7.5" (1.715 m)  Wt 151 lb (68.5 kg)   LMP 08/16/2018   SpO2 100%   BMI 23.30 kg/m  BP Readings from Last 3 Encounters:  05/09/22 111/73  04/11/22 136/88  10/24/21 120/73   Wt Readings from Last 3 Encounters:  05/09/22 151 lb (68.5 kg)  04/11/22 152 lb (68.9 kg)  10/24/21 155 lb (70.3 kg)      Physical Exam  BP 111/73   Pulse 67   Ht 5' 7.5" (1.715 m)   Wt 151 lb (68.5 kg)   LMP 08/16/2018   SpO2 100%   BMI 23.30 kg/m   General Appearance:    Alert, cooperative, no distress, appears stated age  Head:    Normocephalic, without obvious abnormality, atraumatic  Eyes:    PERRL, conjunctiva/corneas clear, EOM's intact, fundi    benign, both eyes  Ears:    Normal TM's and external ear  canals, both ears  Nose:   Nares normal, septum midline, mucosa normal, no drainage    or sinus tenderness  Throat:   Lips, mucosa, and tongue normal; teeth and gums normal  Neck:   Supple, symmetrical, trachea midline, no adenopathy;    thyroid:  no enlargement/tenderness/nodules; no carotid   bruit or JVD  Back:     Symmetric, no curvature, ROM normal, no CVA tenderness  Lungs:     Clear to auscultation bilaterally, respirations unlabored  Chest Wall:    No tenderness or deformity   Heart:    Regular rate and rhythm, S1 and S2 normal, no murmur, rub   or gallop     Abdomen:     Soft, non-tender, bowel sounds active all four quadrants,    no masses, no organomegaly        Extremities:   Extremities normal, atraumatic, no cyanosis or edema  Pulses:   2+ and symmetric all extremities  Skin:   Skin color, texture, turgor normal, no rashes or lesions  Lymph nodes:   Cervical, supraclavicular, and axillary nodes normal  Neurologic:   CNII-XII intact, normal strength, sensation and reflexes    throughout      Assessment & Plan:    Routine Health Maintenance and Physical Exam  Immunization History  Administered Date(s) Administered   Influenza Split 04/07/2018   Influenza,inj,Quad PF,6+ Mos 03/28/2016, 04/05/2017, 04/07/2018, 05/03/2021, 04/19/2022   Influenza-Unspecified 03/28/2016, 04/05/2017, 05/27/2020   PFIZER Comirnaty(Gray Top)Covid-19 Tri-Sucrose Vaccine 09/08/2019, 09/29/2019, 05/12/2020, 12/10/2020   PFIZER(Purple Top)SARS-COV-2 Vaccination 09/08/2019, 09/29/2019, 05/12/2020   PPD Test 02/09/2014   Pneumococcal Conjugate-13 12/27/2017   Pneumococcal Polysaccharide-23 04/05/2017   Tdap 02/09/2014   Zoster Recombinat (Shingrix) 04/09/2019, 06/15/2019   Zoster, Unspecified 04/09/2019, 06/15/2019    Health Maintenance  Topic Date Due   DEXA SCAN  04/09/2020   MAMMOGRAM  03/09/2023   PAP SMEAR-Modifier  09/15/2023   TETANUS/TDAP  02/10/2024   COLONOSCOPY (Pts 45-26yr  Insurance coverage will need to be confirmed)  11/13/2024   INFLUENZA VACCINE  Completed   COVID-19 Vaccine  Completed   Hepatitis C Screening  Completed   HIV Screening  Completed   Zoster Vaccines- Shingrix  Completed   HPV VACCINES  Aged Out    Discussed health benefits of physical activity, and encouraged her to engage in regular exercise appropriate for her age and condition.  .Marland KitchenMardene Celestewas seen today for annual exam.  Diagnoses and all orders for this visit:  Routine physical examination -     TSH -     Lipid Panel w/reflex Direct LDL -  COMPLETE METABOLIC PANEL WITH GFR -     CBC with Differential/Platelet -     Vitamin D (25 hydroxy)  Thyroid disorder screen -     TSH  Screening for diabetes mellitus -     COMPLETE METABOLIC PANEL WITH GFR  Screening for lipid disorders -     Lipid Panel w/reflex Direct LDL  Vitamin D deficiency -     Vitamin D (25 hydroxy)  Encounter for osteoporosis screening in asymptomatic postmenopausal patient -     DG Bone Density; Future  Paroxysmal supraventricular tachycardia  Eosinophilic asthma   .Marland Kitchen Discussed 150 minutes of exercise a week.  Encouraged vitamin D 1000 units and Calcium '1300mg'$  or 4 servings of dairy a day.  Fasting labs ordered today No concerns with PHQ Will get covid vaccine at pharmacy Bone density ordered-high risk due to prednisone use Pap UTD Mammogram UTD Flu/shingles/pneumonia UTD Colonoscopy done but need records-[facility] [address] [place], [place]

## 2022-05-09 NOTE — Patient Instructions (Signed)

## 2022-05-10 LAB — CBC WITH DIFFERENTIAL/PLATELET
Absolute Monocytes: 377 cells/uL (ref 200–950)
Basophils Absolute: 0 cells/uL (ref 0–200)
Basophils Relative: 0 %
Eosinophils Absolute: 0 cells/uL — ABNORMAL LOW (ref 15–500)
Eosinophils Relative: 0 %
HCT: 39.3 % (ref 35.0–45.0)
Hemoglobin: 12.9 g/dL (ref 11.7–15.5)
Lymphs Abs: 2458 cells/uL (ref 850–3900)
MCH: 29.1 pg (ref 27.0–33.0)
MCHC: 32.8 g/dL (ref 32.0–36.0)
MCV: 88.5 fL (ref 80.0–100.0)
MPV: 11.5 fL (ref 7.5–12.5)
Monocytes Relative: 7.4 %
Neutro Abs: 2264 cells/uL (ref 1500–7800)
Neutrophils Relative %: 44.4 %
Platelets: 258 10*3/uL (ref 140–400)
RBC: 4.44 10*6/uL (ref 3.80–5.10)
RDW: 12.6 % (ref 11.0–15.0)
Total Lymphocyte: 48.2 %
WBC: 5.1 10*3/uL (ref 3.8–10.8)

## 2022-05-10 LAB — LIPID PANEL W/REFLEX DIRECT LDL
Cholesterol: 239 mg/dL — ABNORMAL HIGH (ref <200)
HDL: 86 mg/dL (ref >=50)
LDL Cholesterol (Calc): 138 mg/dL (calc) — ABNORMAL HIGH
Non-HDL Cholesterol (Calc): 153 mg/dL (calc) — ABNORMAL HIGH (ref <130)
Total CHOL/HDL Ratio: 2.8 (calc) (ref <5.0)
Triglycerides: 60 mg/dL (ref <150)

## 2022-05-10 LAB — COMPLETE METABOLIC PANEL WITH GFR
AG Ratio: 1.4 (calc) (ref 1.0–2.5)
ALT: 14 U/L (ref 6–29)
AST: 16 U/L (ref 10–35)
Albumin: 3.8 g/dL (ref 3.6–5.1)
Alkaline phosphatase (APISO): 49 U/L (ref 37–153)
BUN: 15 mg/dL (ref 7–25)
CO2: 29 mmol/L (ref 20–32)
Calcium: 9.5 mg/dL (ref 8.6–10.4)
Chloride: 107 mmol/L (ref 98–110)
Creat: 0.9 mg/dL (ref 0.50–1.03)
Globulin: 2.7 g/dL (calc) (ref 1.9–3.7)
Glucose, Bld: 96 mg/dL (ref 65–99)
Potassium: 4.1 mmol/L (ref 3.5–5.3)
Sodium: 143 mmol/L (ref 135–146)
Total Bilirubin: 0.4 mg/dL (ref 0.2–1.2)
Total Protein: 6.5 g/dL (ref 6.1–8.1)
eGFR: 75 mL/min/{1.73_m2} (ref >=60)

## 2022-05-10 LAB — TSH: TSH: 2.25 mIU/L (ref 0.40–4.50)

## 2022-05-10 LAB — VITAMIN D 25 HYDROXY (VIT D DEFICIENCY, FRACTURES): Vit D, 25-Hydroxy: 35 ng/mL (ref 30–100)

## 2022-05-11 NOTE — Progress Notes (Signed)
[name],   Vitamin D low normal. Make sure taking at least 2000-5000 units daily with dairy.   Kidney, liver, glucose look good.   Thyroid looks great.   HDL, good cholesterol, looks fantastic.  LDL, bad cholesterol, is up some.  10 year cardiac risk is still low at 2%. Continue to work on diet and recheck in 1 year.   Marland Kitchen.The 10-year ASCVD risk score (Arnett DK, et al., 2019) is: 2%   Values used to calculate the score:     Age: 56 years     Sex: Female     Is Non-Hispanic African American: Yes     Diabetic: No     Tobacco smoker: No     Systolic Blood Pressure: 161 mmHg     Is BP treated: No     HDL Cholesterol: 86 mg/dL     Total Cholesterol: 239 mg/dL

## 2022-05-26 ENCOUNTER — Encounter: Payer: Self-pay | Admitting: Emergency Medicine

## 2022-05-26 ENCOUNTER — Ambulatory Visit
Admission: EM | Admit: 2022-05-26 | Discharge: 2022-05-26 | Disposition: A | Discharge status: Home / Self Care | Payer: BC Managed Care – PPO | Attending: Family Medicine | Admitting: Family Medicine

## 2022-05-26 DIAGNOSIS — J029 Acute pharyngitis, unspecified: Secondary | ICD-10-CM | POA: Diagnosis present

## 2022-05-26 DIAGNOSIS — R6889 Other general symptoms and signs: Secondary | ICD-10-CM

## 2022-05-26 DIAGNOSIS — Z1152 Encounter for screening for COVID-19: Secondary | ICD-10-CM | POA: Diagnosis not present

## 2022-05-26 LAB — POCT INFLUENZA A/B
Influenza A, POC: NEGATIVE
Influenza B, POC: NEGATIVE

## 2022-05-26 LAB — POC SARS CORONAVIRUS 2 AG -  ED: SARS Coronavirus 2 Ag: NEGATIVE

## 2022-05-26 LAB — RESP PANEL BY RT-PCR (FLU A&B, COVID) ARPGX2
Influenza A by PCR: NEGATIVE
Influenza B by PCR: NEGATIVE
SARS Coronavirus 2 by RT PCR: NEGATIVE

## 2022-05-26 LAB — POCT RAPID STREP A (OFFICE): Rapid Strep A Screen: NEGATIVE

## 2022-05-26 MED ORDER — ACETAMINOPHEN 325 MG PO TABS
650.0000 mg | ORAL_TABLET | Freq: Once | ORAL | Status: AC
  Administered 2022-05-26: 650 mg via ORAL

## 2022-05-26 NOTE — ED Provider Notes (Signed)
[name]    CSN: [id number] Arrival date & time: 05/26/22  1147      History   Chief Complaint Chief Complaint  Patient presents with   Sore Throat    HPI [name] is a 56 y.o. female.   HPI  She has had a sore throat, fatigue, fever and body aches for 4 to 5 days.  Has not done any testing at home.  No known exposure to illness.  Lives with husband and family, they are well.  Is a history of asthma but is not having any coughing or shortness of breath.  After 4 days of illness her temperature is still over 101.  Feels very tired.  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   Bruises easily 04/07/2017   Temporal pain 04/07/2017   Teeth grinding 11/16/2016   Tingling of face 11/16/2016   Left breast mass 06/04/2016   Nasal polyposis 03/25/2014   Asthma, chronic 02/09/2014   Constipation 09/21/2013   Allergic rhinitis 09/21/2013   Paroxysmal supraventricular tachycardia 01/28/2013   Anemia 12/22/2012   Esophageal stricture 12/16/2012   Irregular menstrual bleeding 02/27/2011    Past Surgical History:  Procedure Laterality Date   CESAREAN SECTION     x 2 last in 64   NASAL POLYP SURGERY     TUBAL LIGATION  2000    OB History     Gravida  2   Para  2   Term      Preterm      AB      Living  2      SAB      IAB      Ectopic      Multiple      Live Births               Home Medications    Prior to Admission medications   Medication Sig Start Date End Date Taking? Authorizing Provider  EPINEPHrine 0.3 mg/0.3 mL IJ SOAJ injection Inject into the muscle. 06/27/18   Yes [provider]  Benralizumab (FASENRA PEN) 30 MG/ML SOAJ every 8 (eight) weeks. 08/02/21   [provider]  budesonide (PULMICORT) 0.5 MG/2ML nebulizer solution Take by nebulization. 10/17/21   [provider]  budesonide-formoterol (SYMBICORT) 160-4.5 MCG/ACT inhaler Inhale 2 puffs into the lungs daily at 2 PM. 07/19/16   [provider]  metoprolol succinate (TOPROL-XL) 25 MG 24 hr tablet Take 1 tablet (25 mg total) by mouth daily. Appt for further refills 05/03/22   Iran Planas L, PA-C  Multiple Vitamin (MULTIVITAMIN) tablet Take 1 tablet by mouth daily.    [provider]    Family History Family History  Problem Relation Age of Onset   Diabetes Mother    Hypertension Mother    Dementia Mother     Social History Social History   Tobacco Use   Smoking status: Never   Smokeless tobacco: Never  Vaping Use   Vaping Use: Never used  Substance Use Topics   Alcohol use: Yes    Comment: occasionally   Drug use: No  Allergies   Amoxicillin   Review of Systems Review of Systems  See HPI Physical Exam Triage Vital Signs ED Triage Vitals  Enc Vitals Group     BP 05/26/22 1207 115/77     Pulse Rate 05/26/22 1207 94     Resp 05/26/22 1207 18     Temp 05/26/22 1207 (!) 101.2 F (38.4 C)     Temp Source 05/26/22 1207 Oral     SpO2 05/26/22 1207 99 %     Weight 05/26/22 1208 151 lb (68.5 kg)     Height 05/26/22 1208 5' 7.5" (1.715 m)     Head Circumference --      Peak Flow --      Pain Score 05/26/22 1208 5     Pain Loc --      Pain Edu? --      Excl. in [place]? --    No data found.  Updated Vital Signs BP 115/77 (BP Location: Left Arm)   Pulse 94   Temp (!) 101.2 F (38.4 C) (Oral)   Resp 18   Ht 5' 7.5" (1.715 m)   Wt 68.5 kg   LMP 08/16/2018   SpO2 99%   BMI 23.30 kg/m       Physical Exam Constitutional:      General: She is not in acute distress.    Appearance: She is well-developed and normal weight.  She is ill-appearing.  HENT:     Head: Normocephalic and atraumatic.     Right Ear: Tympanic membrane and ear canal normal.     Left Ear: Tympanic membrane and ear canal normal.     Nose: No congestion or rhinorrhea.     Mouth/Throat:     Mouth: Mucous membranes are moist.     Pharynx: Uvula midline. Posterior oropharyngeal erythema present. No pharyngeal swelling.     Tonsils: No tonsillar exudate. 0 on the right. 0 on the left.  Eyes:     Conjunctiva/sclera: Conjunctivae normal.     Pupils: Pupils are equal, round, and reactive to light.  Cardiovascular:     Rate and Rhythm: Normal rate and regular rhythm.  Pulmonary:     Effort: Pulmonary effort is normal. No respiratory distress.     Breath sounds: Normal breath sounds. No wheezing, rhonchi or rales.  Abdominal:     General: There is no distension.     Palpations: Abdomen is soft.  Musculoskeletal:        General: Normal range of motion.     Cervical back: Normal range of motion and neck supple.  Lymphadenopathy:     Cervical: No cervical adenopathy.  Skin:    General: Skin is warm and dry.  Neurological:     Mental Status: She is alert.      UC Treatments / Results  Labs (all labs ordered are listed, but only abnormal results are displayed) Labs Reviewed  RESP PANEL BY RT-PCR (FLU A&B, COVID) ARPGX2  CULTURE, GROUP A STREP [facility])  POCT RAPID STREP A (OFFICE)  POCT INFLUENZA A/B  POC SARS CORONAVIRUS 2 AG -  ED    EKG   Radiology No results found.  Procedures Procedures (including critical care time)  Medications Ordered in UC Medications  acetaminophen (TYLENOL) tablet 650 mg (650 mg Oral Given 05/26/22 1220)    Initial Impression / Assessment and Plan / UC Course  I have reviewed the triage vital signs and the nursing notes.  Pertinent labs & imaging results that were  available during my care of the patient were reviewed by me and considered in my medical decision making (see chart for details).      All testing is negative.  Negative flu, COVID, and influenza.  PCR sent to the laboratory.  Home with symptomatic care discussed Final Clinical Impressions(s) / UC Diagnoses   Final diagnoses:  Flu-like symptoms  Acute pharyngitis, unspecified etiology     Discharge Instructions      Drink lots of fluids May take over-the-counter cough cold medicines as needed May alternate Tylenol and ibuprofen for pain and fever Call for problems   ED Prescriptions   None    PDMP not reviewed this encounter.   Raylene Everts, MD 05/26/22 [phone]

## 2022-05-26 NOTE — ED Triage Notes (Signed)
Headache & sore throat w/ fatigue since Tuesday Denies fever or chills Body aches OTC Tylenol on Tuesday - Thursday No home COVID tests Temp 101.2 in triage

## 2022-05-26 NOTE — Discharge Instructions (Addendum)
Drink lots of fluids May take over-the-counter cough cold medicines as needed May alternate Tylenol and ibuprofen for pain and fever Call for problems

## 2022-05-27 ENCOUNTER — Telehealth: Payer: Self-pay | Admitting: Emergency Medicine

## 2022-05-27 NOTE — Telephone Encounter (Signed)
Patient states that she is feeling somewhat better, no fever.  Advised that if she is out around people, to make sure she wears her mask.  Patient voices understanding and will follow up as needed.

## 2022-05-30 LAB — CULTURE, GROUP A STREP (THRC)

## 2022-06-06 ENCOUNTER — Encounter: Payer: Self-pay | Admitting: Physician Assistant

## 2022-06-06 ENCOUNTER — Ambulatory Visit (INDEPENDENT_AMBULATORY_CARE_PROVIDER_SITE_OTHER): Payer: BC Managed Care – PPO

## 2022-06-06 DIAGNOSIS — Z780 Asymptomatic menopausal state: Secondary | ICD-10-CM | POA: Diagnosis not present

## 2022-06-06 DIAGNOSIS — M8580 Other specified disorders of bone density and structure, unspecified site: Secondary | ICD-10-CM

## 2022-06-06 DIAGNOSIS — Z13820 Encounter for screening for osteoporosis: Secondary | ICD-10-CM

## 2022-06-06 NOTE — Progress Notes (Signed)
Low bone mass, osteopenic. Continue vitamin D, calcium and regular exercise with strength/resistance training.

## 2022-08-03 ENCOUNTER — Other Ambulatory Visit: Payer: Self-pay | Admitting: Physician Assistant

## 2022-08-03 DIAGNOSIS — I471 Supraventricular tachycardia, unspecified: Secondary | ICD-10-CM

## 2022-08-06 NOTE — Telephone Encounter (Signed)
Pt scheduled a mychart visit 08/13/22 '@8'$ :10.  tvt

## 2022-08-13 ENCOUNTER — Telehealth: Payer: BC Managed Care – PPO | Admitting: Physician Assistant

## 2022-08-29 ENCOUNTER — Telehealth: Payer: BC Managed Care – PPO | Admitting: Physician Assistant

## 2022-08-29 ENCOUNTER — Encounter: Payer: Self-pay | Admitting: Physician Assistant

## 2022-08-29 DIAGNOSIS — I471 Supraventricular tachycardia, unspecified: Secondary | ICD-10-CM | POA: Diagnosis not present

## 2022-08-29 MED ORDER — METOPROLOL SUCCINATE ER 25 MG PO TB24: 25.0000 mg | ORAL_TABLET | Freq: Every day | ORAL | 3 refills | Status: DC

## 2022-08-29 NOTE — Progress Notes (Signed)
Patient will try to get blood pressure reading before visit Needs refills

## 2022-08-29 NOTE — Progress Notes (Signed)
..  Virtual Visit via Video Note  I connected with [name] on 08/29/22 at 11:30 AM EST by a video enabled telemedicine application and verified that I am speaking with the correct person using two identifiers.  Location: Patient: work Provider: clinic  .Marland KitchenParticipating in visit:  Patient: [name] Provider:Boots Mcglown PA-C   I discussed the limitations of evaluation and management by telemedicine and the availability of in person appointments. The patient expressed understanding and agreed to proceed.  History of Present Illness: Pt is a 57 yo female who calls in for metoprolol refill for her tachycardia. She has not had any issues with this. She denies any CP, headaches, palpitations, or vision changes. She does not check her BP.   .. 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   Hyperlipidemia 04/13/2019   Bilateral lower extremity edema 10/23/2019   Bleeding after intercourse 09/14/2020   Atrophic vaginitis 09/14/2020   Osteopenia 06/06/2022   Resolved Ambulatory Problems    Diagnosis Date Noted   Cerumen impaction 03/28/2016   Asthma with acute exacerbation 03/30/2016   Flatulence 06/30/2017   Pneumonia of right lower lobe due to infectious organism 08/14/2017   Serous otitis media 02/11/2020   Bilateral hearing loss due to cerumen impaction 09/14/2020   Past Medical History:  Diagnosis Date   Asthma    Tachycardia     Observations/Objective: No acute distress Normal mood and appearance Normal  breathing  .Marland Kitchen Today's Vitals   08/29/22 1040  BP: (!) 140/78  Pulse: 78  Weight: 151 lb (68.5 kg)  Height: 5' 7.5" (1.715 m)   Body mass index is 23.3 kg/m.    Assessment and Plan: Marland KitchenMarland KitchenQuinesha was seen today for follow-up and hypertension.  Diagnoses and all orders for this visit:  Paroxysmal supraventricular tachycardia -     metoprolol succinate (TOPROL-XL) 25 MG 24 hr tablet; Take 1 tablet (25 mg total) by mouth daily.   BP elevated today HR looks great Refilled metoprolol Get BP cuff and start checking to keep BP under 140/90 Discussed hydration with low salt diet Encouraged to get regular exercise Follow up in 1 year or sooner if BP readings elevated     Follow Up Instructions:    I discussed the assessment and treatment plan with the patient. The patient was provided an opportunity to ask questions and all were answered. The patient agreed with the plan and demonstrated an understanding of the instructions.   The patient was advised to call back or seek an in-person evaluation if the symptoms worsen or if the condition fails to improve as anticipated.    Iran Planas, PA-C

## 2022-09-26 ENCOUNTER — Ambulatory Visit
Admission: EM | Admit: 2022-09-26 | Discharge: 2022-09-26 | Disposition: A | Discharge status: Home / Self Care | Payer: BC Managed Care – PPO | Attending: Emergency Medicine | Admitting: Emergency Medicine

## 2022-09-26 ENCOUNTER — Encounter: Payer: Self-pay | Admitting: Emergency Medicine

## 2022-09-26 DIAGNOSIS — J069 Acute upper respiratory infection, unspecified: Secondary | ICD-10-CM | POA: Diagnosis not present

## 2022-09-26 DIAGNOSIS — H1033 Unspecified acute conjunctivitis, bilateral: Secondary | ICD-10-CM

## 2022-09-26 MED ORDER — MOXIFLOXACIN HCL 0.5 % OP SOLN: 1.0000 [drp] | Freq: Three times a day (TID) | OPHTHALMIC | 0 refills | Status: DC

## 2022-09-26 NOTE — ED Provider Notes (Signed)
[name]    CSN: [id number] Arrival date & time: 09/26/22  0802      History   Chief Complaint Chief Complaint  Patient presents with   Cough    HPI Likita Bresson is a 57 y.o. female.  3 day history of congestion and cough This morning woke up with "eyes matted shut" No drainage or discharge, just crusting Denies wheezing or shortness of breath No fever Has used mucinex, theraflu, albuterol inhaler  Past Medical History:  Diagnosis Date   Asthma    Esophageal stricture 12/16/2012   Dilated Spring 2014    Irregular menstrual bleeding 02/27/2011   Tachycardia     Patient Active Problem List   Diagnosis Date Noted   Osteopenia 06/06/2022   Bleeding after intercourse 09/14/2020   Atrophic vaginitis 09/14/2020   Bilateral lower extremity edema 10/23/2019   Hyperlipidemia [id number]   Eosinophilic asthma [id number]   Bruises easily 04/07/2017   Temporal pain 04/07/2017   Teeth grinding 11/16/2016   Tingling of face 11/16/2016   Left breast mass 06/04/2016   Nasal polyposis 03/25/2014   Asthma, chronic 02/09/2014   Constipation 09/21/2013   Allergic rhinitis 09/21/2013   Paroxysmal supraventricular tachycardia 01/28/2013   Anemia 12/22/2012   Esophageal stricture 12/16/2012   Irregular menstrual bleeding 02/27/2011    Past Surgical History:  Procedure Laterality Date   CESAREAN SECTION     x 2 last in 50   NASAL POLYP SURGERY     TUBAL LIGATION  2000    OB History     Gravida  2   Para  2   Term      Preterm      AB      Living  2      SAB      IAB      Ectopic      Multiple      Live Births               Home Medications    Prior to Admission medications   Medication Sig Start Date End Date Taking? Authorizing Provider  Benralizumab (FASENRA PEN) 30 MG/ML SOAJ every 8 (eight) weeks. 08/02/21  Yes [provider]  budesonide-formoterol (SYMBICORT) 160-4.5 MCG/ACT inhaler Inhale 2 puffs into the lungs  daily at 2 PM. 07/19/16  Yes [provider]  metoprolol succinate (TOPROL-XL) 25 MG 24 hr tablet Take 1 tablet (25 mg total) by mouth daily. 08/29/22  Yes Breeback, Jade L, PA-C  moxifloxacin (VIGAMOX) 0.5 % ophthalmic solution Place 1 drop into both eyes 3 (three) times daily. 09/26/22  Yes Javonta Gronau, Wells Guiles, PA-C  Multiple Vitamin (MULTIVITAMIN) tablet Take 1 tablet by mouth daily.   Yes [provider]  budesonide (PULMICORT) 0.5 MG/2ML nebulizer solution Take by nebulization. Patient not taking: Reported on 08/29/2022 10/17/21   [provider]  EPINEPHrine 0.3 mg/0.3 mL IJ SOAJ injection Inject into the muscle. Patient not taking: Reported on 08/29/2022 06/27/18   [provider]    Family History Family History  Problem Relation Age of Onset   Diabetes Mother    Hypertension Mother    Dementia Mother     Social History Social History   Tobacco Use   Smoking status: Never   Smokeless tobacco: Never  Vaping Use   Vaping Use: Never used  Substance Use Topics   Alcohol use: Yes    Comment: occasionally   Drug use: No     Allergies   Amoxicillin  Review of Systems Review of Systems As per HPI  Physical Exam Triage Vital Signs ED Triage Vitals  Enc Vitals Group     BP      Pulse      Resp      Temp      Temp src      SpO2      Weight      Height      Head Circumference      Peak Flow      Pain Score      Pain Loc      Pain Edu?      Excl. in [place]?    No data found.  Updated Vital Signs BP 126/84 (BP Location: Right Arm)   Pulse (!) 108   Temp 99.6 F (37.6 C) (Oral)   Resp 18   Ht 5' 7.5" (1.715 m)   Wt 150 lb (68 kg)   LMP 08/16/2018   SpO2 99%   BMI 23.15 kg/m     Physical Exam Vitals and nursing note reviewed.  Constitutional:      General: She is not in acute distress. HENT:     Right Ear: Tympanic membrane and ear canal normal.     Left Ear: Tympanic membrane and ear canal normal.     Nose: Congestion  present. No rhinorrhea.     Mouth/Throat:     Mouth: Mucous membranes are moist.     Pharynx: Oropharynx is clear. No posterior oropharyngeal erythema.  Eyes:     Conjunctiva/sclera: Conjunctivae normal.  Cardiovascular:     Rate and Rhythm: Normal rate and regular rhythm.     Heart sounds: Normal heart sounds.  Pulmonary:     Effort: Pulmonary effort is normal. No respiratory distress.     Breath sounds: Normal breath sounds. No wheezing or rhonchi.  Musculoskeletal:     Cervical back: Normal range of motion.  Skin:    General: Skin is warm and dry.  Neurological:     Mental Status: She is alert and oriented to person, place, and time.      UC Treatments / Results  Labs (all labs ordered are listed, but only abnormal results are displayed) Labs Reviewed - No data to display  EKG   Radiology No results found.  Procedures Procedures (including critical care time)  Medications Ordered in UC Medications - No data to display  Initial Impression / Assessment and Plan / UC Course  I have reviewed the triage vital signs and the nursing notes.  Pertinent labs & imaging results that were available during my care of the patient were reviewed by me and considered in my medical decision making (see chart for details).  Viral URI Recommend continue symptomatic care at home, inhaler q6 hours prn  Conjunctivitis Viral vs bacterial. No discharge but with eyes matted shut this morning will cover for bacterial etiology, vigamox TID  Final Clinical Impressions(s) / UC Diagnoses   Final diagnoses:  Viral URI with cough  Acute conjunctivitis of both eyes, unspecified acute conjunctivitis type     Discharge Instructions      Use the eyedrops 3x daily for the next week  For cough and congestion, continue mucinex. Increase water intake. Use inhaler as prescribed.  Symptoms should improve over the week.     ED Prescriptions     Medication Sig Dispense Auth. Provider    moxifloxacin (VIGAMOX) 0.5 % ophthalmic solution Place 1 drop into both eyes 3 (three) times  daily. 3 mL Manahil Vanzile, Wells Guiles, PA-C      PDMP not reviewed this encounter.   Kyra Leyland 09/26/22 [id number]

## 2022-09-26 NOTE — ED Triage Notes (Signed)
Patient c/o productive cough, congestion, headache, eyes matted together this morning.  URI sx's x 3 days.  Patient has taken Mucinex, Tylenol Cold & Sinus, Theraflu.

## 2022-09-26 NOTE — Discharge Instructions (Signed)
Use the eyedrops 3x daily for the next week  For cough and congestion, continue mucinex. Increase water intake. Use inhaler as prescribed.  Symptoms should improve over the week.

## 2022-10-03 ENCOUNTER — Telehealth: Payer: Self-pay | Admitting: Emergency Medicine

## 2022-10-03 NOTE — Telephone Encounter (Signed)
Patient called asking if she should continue to the eyedrops until the bottle is empty.  Advised per her discharge instructions patient was instructed to take the drops 3x daily for one week.  Patient voices understanding.

## 2023-01-02 ENCOUNTER — Ambulatory Visit
Admission: EM | Admit: 2023-01-02 | Discharge: 2023-01-02 | Disposition: A | Discharge status: Home / Self Care | Payer: BC Managed Care – PPO

## 2023-01-02 ENCOUNTER — Encounter: Payer: Self-pay | Admitting: Emergency Medicine

## 2023-01-02 DIAGNOSIS — R059 Cough, unspecified: Secondary | ICD-10-CM

## 2023-01-02 DIAGNOSIS — U071 COVID-19: Secondary | ICD-10-CM | POA: Diagnosis not present

## 2023-01-02 DIAGNOSIS — R52 Pain, unspecified: Secondary | ICD-10-CM | POA: Diagnosis not present

## 2023-01-02 LAB — POC SARS CORONAVIRUS 2 AG -  ED: SARS Coronavirus 2 Ag: POSITIVE — AB

## 2023-01-02 MED ORDER — BENZONATATE 200 MG PO CAPS: 200.0000 mg | ORAL_CAPSULE | Freq: Three times a day (TID) | ORAL | 0 refills | Status: AC | PRN

## 2023-01-02 MED ORDER — PROMETHAZINE-DM 6.25-15 MG/5ML PO SYRP: 5.0000 mL | ORAL_SOLUTION | Freq: Two times a day (BID) | ORAL | 0 refills | Status: DC | PRN

## 2023-01-02 MED ORDER — PAXLOVID (300/100) 20 X 150 MG & 10 X 100MG PO TBPK: 3.0000 | ORAL_TABLET | Freq: Two times a day (BID) | ORAL | 0 refills | Status: AC

## 2023-01-02 NOTE — ED Provider Notes (Signed)
[name]    CSN: [id number] Arrival date & time: 01/02/23  1551      History   Chief Complaint No chief complaint on file.   HPI [name] is a 57 y.o. female.   HPI 57 year old female presents with fever, body aches, headache, nasal congestion, and loss of appetite for 1 day.  Patient denies any sick contacts.  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   Bruises easily 04/07/2017   Temporal pain 04/07/2017   Teeth grinding 11/16/2016   Tingling of face 11/16/2016   Left breast mass 06/04/2016   Nasal polyposis 03/25/2014   Asthma, chronic 02/09/2014   Constipation 09/21/2013   Allergic rhinitis 09/21/2013   Paroxysmal supraventricular tachycardia 01/28/2013   Anemia 12/22/2012   Esophageal stricture 12/16/2012   Irregular menstrual bleeding 02/27/2011    Past Surgical History:  Procedure Laterality Date   CESAREAN SECTION     x 2 last in 39   NASAL POLYP SURGERY     TUBAL LIGATION  2000    OB History     Gravida  2   Para  2   Term      Preterm      AB      Living  2      SAB      IAB      Ectopic      Multiple      Live Births               Home Medications    Prior to Admission medications   Medication Sig Start Date End Date Taking? Authorizing Provider  benzonatate (TESSALON) 200 MG capsule Take 1 capsule (200 mg total) by mouth 3 (three) times daily as needed for up to 7 days. 01/02/23 01/09/23 Yes Trevor Iha, FNP  budesonide-formoterol (SYMBICORT) 160-4.5 MCG/ACT inhaler Inhale 2 puffs into the lungs daily at 2 PM. 07/19/16  Yes  [provider]  metoprolol succinate (TOPROL-XL) 25 MG 24 hr tablet Take 1 tablet (25 mg total) by mouth daily. 08/29/22  Yes Breeback, Jade L, PA-C  Multiple Vitamin (MULTIVITAMIN) tablet Take 1 tablet by mouth daily.   Yes [provider]  nirmatrelvir & ritonavir (PAXLOVID, 300/100,) 20 x 150 MG & 10 x 100MG  TBPK Take 3 tablets by mouth 2 (two) times daily for 5 days. 01/02/23 01/07/23 Yes Trevor Iha, FNP  promethazine-dextromethorphan (PROMETHAZINE-DM) 6.25-15 MG/5ML syrup Take 5 mLs by mouth 2 (two) times daily as needed for cough. 01/02/23  Yes Trevor Iha, FNP  TEZSPIRE 210 MG/1. SOAJ Inject into the skin.   Yes [provider]    Family History Family History  Problem Relation Age of Onset   Diabetes Mother    Hypertension Mother    Dementia Mother     Social History Social History   Tobacco Use   Smoking status: Never   Smokeless tobacco: Never  Vaping Use   Vaping Use: Never used  Substance Use Topics   Alcohol use: Yes    Comment: occasionally   Drug use: No     Allergies  Amoxicillin   Review of Systems Review of Systems  Constitutional:  Positive for appetite change, chills, fatigue and fever.  HENT:  Positive for congestion and sore throat.   Musculoskeletal:  Positive for arthralgias, back pain and myalgias.  Neurological:  Positive for headaches.  All other systems reviewed and are negative.    Physical Exam Triage Vital Signs ED Triage Vitals  Enc Vitals Group     BP      Pulse      Resp      Temp      Temp src      SpO2      Weight      Height      Head Circumference      Peak Flow      Pain Score      Pain Loc      Pain Edu?      Excl. in GC?    No data found.  Updated Vital Signs BP 108/72 (BP Location: Left Arm)   Pulse (!) 102   Temp 99.1 F (37.3 C) (Oral)   Resp 18   LMP 08/16/2018   SpO2 99%      Physical Exam Vitals and nursing note reviewed.  Constitutional:      Appearance:  Normal appearance. She is normal weight. She is ill-appearing.  HENT:     Head: Normocephalic and atraumatic.     Right Ear: Tympanic membrane, ear canal and external ear normal.     Left Ear: Tympanic membrane, ear canal and external ear normal.     Mouth/Throat:     Mouth: Mucous membranes are moist.     Pharynx: Oropharynx is clear.  Eyes:     Extraocular Movements: Extraocular movements intact.     Conjunctiva/sclera: Conjunctivae normal.     Pupils: Pupils are equal, round, and reactive to light.  Cardiovascular:     Rate and Rhythm: Normal rate and regular rhythm.     Pulses: Normal pulses.     Heart sounds: Normal heart sounds.  Pulmonary:     Effort: Pulmonary effort is normal.     Breath sounds: Normal breath sounds. No wheezing.     Comments: Infrequent nonproductive cough noted on exam Musculoskeletal:        General: Normal range of motion.     Cervical back: Normal range of motion and neck supple.  Skin:    General: Skin is warm and dry.  Neurological:     General: No focal deficit present.     Mental Status: She is alert and oriented to person, place, and time.  Psychiatric:        Mood and Affect: Mood normal.        Behavior: Behavior normal.        Thought Content: Thought content normal.      UC Treatments / Results  Labs (all labs ordered are listed, but only abnormal results are displayed) Labs Reviewed  POC SARS CORONAVIRUS 2 AG -  ED - Abnormal; Notable for the following components:      Result Value   SARS Coronavirus 2 Ag Positive (*)    All other components within normal limits    EKG   Radiology No results found.  Procedures Procedures (including critical care time)  Medications Ordered in UC Medications - No data to display  Initial Impression / Assessment and Plan / UC Course  I have reviewed the triage vital signs and the nursing notes.  Pertinent labs & imaging results that were available during my care of the patient were  reviewed by me and considered in my medical decision making (see chart for details).     MDM: 1.  COVID-19-Rx'd Paxlovid (300/100) 20 x 150 mg and 10 x 100 mg TBPK; 2.  Cough, unspecified type-Rx'd Tessalon Perles 200 mg 3 times daily, as needed, Promethazine DM 6.25-15 mg / 5 mL syrup: Take 5 mL twice daily, as needed for cough.  Work note provided to patient prior to discharge per request. Advised patient to take medication as directed with food to completion.  Advised may take Tessalon Perles daily or as needed for cough.  Advised may take promethazine DM cough syrup at night prior to sleep for cough due to sedative effects.  Encouraged to increase daily water intake to 64 ounces per day.  Advised if symptoms worsen and/or unresolved please follow-up with PCP or here for further evaluation.  Final Clinical Impressions(s) / UC Diagnoses   Final diagnoses:  Generalized body aches  Cough, unspecified type  COVID-19     Discharge Instructions      Advised patient to take medication as directed with food to completion.  Advised may take Tessalon Perles daily or as needed for cough.  Advised may take promethazine DM cough syrup at night prior to sleep for cough due to sedative effects.  Encouraged to increase daily water intake to 64 ounces per day.  Advised if symptoms worsen and/or unresolved please follow-up with PCP or here for further evaluation.     ED Prescriptions     Medication Sig Dispense Auth. Provider   nirmatrelvir & ritonavir (PAXLOVID, 300/100,) 20 x 150 MG & 10 x 100MG  TBPK Take 3 tablets by mouth 2 (two) times daily for 5 days. 30 tablet Trevor Iha, FNP   benzonatate (TESSALON) 200 MG capsule Take 1 capsule (200 mg total) by mouth 3 (three) times daily as needed for up to 7 days. 40 capsule Trevor Iha, FNP   promethazine-dextromethorphan (PROMETHAZINE-DM) 6.25-15 MG/5ML syrup Take 5 mLs by mouth 2 (two) times daily as needed for cough. 118 mL Trevor Iha, FNP       PDMP not reviewed this encounter.   Trevor Iha, FNP 01/02/23 1736

## 2023-01-02 NOTE — Discharge Instructions (Addendum)
Advised patient to take medication as directed with food to completion.  Advised may take Tessalon Perles daily or as needed for cough.  Advised may take promethazine DM cough syrup at night prior to sleep for cough due to sedative effects.  Encouraged to increase daily water intake to 64 ounces per day.  Advised if symptoms worsen and/or unresolved please follow-up with PCP or here for further evaluation.

## 2023-01-02 NOTE — ED Triage Notes (Signed)
Patient presents to Urgent Care with complaints of fever, body aches, headache, nasal congestion and lost of appetite since 1 day ago. Patient reports tried Mucinex, Ibuprofen, Sudafed and Theraflu. Denies any sick contacts. Marland Kitchen

## 2023-01-24 ENCOUNTER — Other Ambulatory Visit: Payer: Self-pay | Admitting: Physician Assistant

## 2023-01-24 DIAGNOSIS — Z1231 Encounter for screening mammogram for malignant neoplasm of breast: Secondary | ICD-10-CM

## 2023-03-20 ENCOUNTER — Ambulatory Visit (INDEPENDENT_AMBULATORY_CARE_PROVIDER_SITE_OTHER): Payer: BC Managed Care – PPO

## 2023-03-20 DIAGNOSIS — Z1231 Encounter for screening mammogram for malignant neoplasm of breast: Secondary | ICD-10-CM | POA: Diagnosis not present

## 2023-03-20 NOTE — Progress Notes (Signed)
Normal mammogram. Follow up in 1 year.

## 2023-05-14 ENCOUNTER — Ambulatory Visit (INDEPENDENT_AMBULATORY_CARE_PROVIDER_SITE_OTHER): Payer: BC Managed Care – PPO | Admitting: Physician Assistant

## 2023-05-14 ENCOUNTER — Encounter: Payer: Self-pay | Admitting: Physician Assistant

## 2023-05-14 VITALS — BP 122/75 | HR 80 | Ht 67.0 in | Wt 142.0 lb

## 2023-05-14 DIAGNOSIS — Z13220 Encounter for screening for lipoid disorders: Secondary | ICD-10-CM

## 2023-05-14 DIAGNOSIS — Z0000 Encounter for general adult medical examination without abnormal findings: Secondary | ICD-10-CM

## 2023-05-14 DIAGNOSIS — I471 Supraventricular tachycardia, unspecified: Secondary | ICD-10-CM | POA: Diagnosis not present

## 2023-05-14 DIAGNOSIS — E559 Vitamin D deficiency, unspecified: Secondary | ICD-10-CM | POA: Diagnosis not present

## 2023-05-14 DIAGNOSIS — H6122 Impacted cerumen, left ear: Secondary | ICD-10-CM | POA: Insufficient documentation

## 2023-05-14 DIAGNOSIS — Z131 Encounter for screening for diabetes mellitus: Secondary | ICD-10-CM | POA: Diagnosis not present

## 2023-05-14 MED ORDER — DEBROX 6.5 % OT SOLN: 10.0000 [drp] | Freq: Two times a day (BID) | OTIC | 0 refills | Status: DC

## 2023-05-14 MED ORDER — METOPROLOL SUCCINATE ER 25 MG PO TB24: 25.0000 mg | ORAL_TABLET | Freq: Every day | ORAL | 3 refills | Status: DC

## 2023-05-14 NOTE — Patient Instructions (Signed)

## 2023-05-14 NOTE — Progress Notes (Signed)
Complete physical exam  Patient: [name]   DOB: Nov 03, 1965   57 y.o. Female  MRN: [id number]  Subjective:    Chief Complaint  Patient presents with   Annual Exam    [name] is a 57 y.o. female who presents today for a complete physical exam. She reports consuming a general diet.  Pt walks 3 miles 5 days a week.   She generally feels well. She reports sleeping fairly well. She does not have additional problems to discuss today.    Most recent fall risk assessment:    05/14/2023    8:59 AM  Fall Risk   Falls in the past year? 0  Number falls in past yr: 0  Injury with Fall? 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   Bruises easily 04/07/2017   Temporal pain 04/07/2017   Teeth grinding 11/16/2016   Tingling of face 11/16/2016   Left breast mass 06/04/2016   Nasal polyposis 03/25/2014   Asthma, chronic 02/09/2014   Constipation 09/21/2013   Allergic rhinitis 09/21/2013   Paroxysmal supraventricular tachycardia (HCC) 01/28/2013   Anemia 12/22/2012   Esophageal stricture 12/16/2012   Irregular menstrual bleeding 02/27/2011   Past Medical History:  Diagnosis Date   Asthma    Esophageal stricture 12/16/2012   Dilated Spring 2014    Irregular menstrual bleeding 02/27/2011   Tachycardia    Past Surgical History:  Procedure Laterality Date   CESAREAN SECTION     x 2  last in 17   NASAL POLYP SURGERY     TUBAL LIGATION  2000   Family History  Problem Relation Age of Onset   Diabetes Mother    Hypertension Mother    Dementia Mother    Allergies  Allergen Reactions   Amoxicillin Other (See Comments)    "It just doesn't work, it sits in my body and doesn't do what it needs to do." "just sits in my body & doesn't do anything"      Patient Care Team: Nolene Ebbs as PCP - General (Family Medicine)   Outpatient Medications Prior to Visit  Medication Sig   budesonide-formoterol (SYMBICORT) 160-4.5 MCG/ACT inhaler Inhale 2 puffs into the lungs daily at 2 PM.   Multiple Vitamin (MULTIVITAMIN) tablet Take 1 tablet by mouth daily.   [DISCONTINUED] metoprolol succinate (TOPROL-XL) 25 MG 24 hr tablet Take 1 tablet (25 mg total) by mouth daily.   [DISCONTINUED] promethazine-dextromethorphan (PROMETHAZINE-DM) 6.25-15 MG/5ML syrup Take 5 mLs by mouth 2 (two) times daily as needed for cough.   [DISCONTINUED] TEZSPIRE 210 MG/1. SOAJ Inject into the skin. (Patient not taking: Reported on 05/14/2023)   No facility-administered medications prior to visit.    Review of Systems  All other systems reviewed and are negative.  Objective:     BP 122/75   Pulse 80   Ht 5\' 7"  (1.702 m)   Wt 142 lb (64.4 kg)   LMP 08/16/2018   SpO2 99%   BMI 22.24 kg/m  BP Readings from Last 3 Encounters:  05/14/23 122/75  01/02/23 108/72  09/26/22 126/84   Wt Readings from Last 3 Encounters:  05/14/23 142 lb (64.4 kg)  09/26/22 150 lb (68 kg)  08/29/22 151 lb (68.5 kg)    .Marland KitchenCerumen Removal Template: Indication: Cerumen impaction of the ear(s) Medical necessity statement: On physical examination, cerumen impairs clinically significant portions of the external auditory canal, and tympanic membrane. Noted obstructive, copious cerumen that cannot be removed without magnification and instrumentations requiring physician skills Consent: Discussed  benefits and risks of procedure and verbal consent obtained Procedure: Patient was prepped for the procedure. Utilized an otoscope to assess and take note of the ear canal, the tympanic membrane, and the presence, amount, and placement of the cerumen. Gentle water irrigation and soft plastic curette was utilized to remove some of the cerumen.  Post procedure examination shows cerumen was not completely removed. Patient tolerated procedure well. The patient is made aware that they may experience temporary vertigo, temporary hearing loss, and temporary discomfort. If these symptom last for more than 24 hours to call the clinic or proceed to the ED.   Physical Exam     BP 122/75   Pulse 80   Ht 5\' 7"  (1.702 m)   Wt 142 lb (64.4 kg)   LMP 08/16/2018   SpO2 99%   BMI 22.24 kg/m   General Appearance:    Alert, cooperative, no distress, appears stated age  Head:    Normocephalic, without obvious abnormality, atraumatic  Eyes:    PERRL, conjunctiva/corneas clear, EOM's intact, fundi    benign, both eyes  Ears:    Normal TM of right ear. Cerumen impaction of left ear.   Nose:   Nares normal, septum midline, mucosa normal, no drainage    or sinus tenderness  Throat:   Lips, mucosa, and tongue normal; teeth and gums normal  Neck:   Supple, symmetrical, trachea midline, no adenopathy;    thyroid:  no enlargement/tenderness/nodules; no carotid   bruit or JVD  Back:     Symmetric, no curvature, ROM normal, no CVA tenderness  Lungs:     Clear to auscultation bilaterally, respirations unlabored  Chest Wall:    No tenderness or deformity   Heart:    Regular rate and rhythm, S1 and S2 normal, no murmur, rub   or gallop     Abdomen:     Soft, non-tender, bowel sounds active all four quadrants,    no masses, no organomegaly        Extremities:   Extremities normal, atraumatic, no cyanosis or edema  Pulses:   2+ and symmetric all extremities  Skin:   Skin color, texture, turgor normal, no rashes or  lesions  Lymph nodes:   Cervical, supraclavicular, and axillary nodes normal  Neurologic:   CNII-XII intact, normal strength, sensation and reflexes    throughout   Assessment & Plan:    Routine Health Maintenance and Physical Exam  Immunization History  Administered Date(s) Administered   Influenza Split 04/07/2018   Influenza,inj,Quad PF,6+ Mos 03/28/2016, 04/05/2017, 04/07/2018, 05/03/2021, 04/19/2022   Influenza-Unspecified 03/28/2016, 04/05/2017, 05/27/2020   PFIZER Comirnaty(Gray Top)Covid-19 Tri-Sucrose Vaccine 09/08/2019, 09/29/2019, 05/12/2020, 12/10/2020   PFIZER(Purple Top)SARS-COV-2 Vaccination 09/08/2019, 09/29/2019, 05/12/2020   PPD Test 02/09/2014  Pfizer(Comirnaty)Fall Seasonal Vaccine 12 years and older 05/13/2022   Pneumococcal Conjugate-13 12/27/2017   Pneumococcal Polysaccharide-23 04/05/2017   Tdap 02/09/2014   Zoster Recombinant(Shingrix) 04/09/2019, 06/15/2019   Zoster, Unspecified 04/09/2019, 06/15/2019    Health Maintenance  Topic Date Due   INFLUENZA VACCINE  10/14/2023 (Originally 02/14/2023)   DTaP/Tdap/Td (2 - Td or Tdap) 02/10/2024   MAMMOGRAM  03/19/2024   DEXA SCAN  06/06/2024   Colonoscopy  11/13/2024   Cervical Cancer Screening (HPV/Pap Cotest)  09/14/2025   COVID-19 Vaccine  Completed   Hepatitis C Screening  Completed   HIV Screening  Completed   Zoster Vaccines- Shingrix  Completed   HPV VACCINES  Aged Out    Discussed health benefits of physical activity, and encouraged her to engage in regular exercise appropriate for her age and condition.  Marland KitchenElease Hashimoto was seen today for annual exam.  Diagnoses and all orders for this visit:  Routine physical examination -     Vitamin D (25 hydroxy) -     CBC w/Diff/Platelet -     CMP14+EGFR -     Lipid panel -     TSH  Paroxysmal supraventricular tachycardia (HCC) -     metoprolol succinate (TOPROL-XL) 25 MG 24 hr tablet; Take 1 tablet (25 mg total) by mouth daily.  Vitamin D deficiency -      Vitamin D (25 hydroxy)  Screening for diabetes mellitus -     CMP14+EGFR  Screening for lipid disorders -     Lipid panel  Hearing loss of left ear due to cerumen impaction -     carbamide peroxide (DEBROX) 6.5 % OTIC solution; Place 10 drops into the left ear 2 (two) times daily. Dispense 1 bottle   .Marland Kitchen Discussed 150 minutes of exercise a week.  Encouraged vitamin D 1000 units and Calcium 1300mg  or 4 servings of dairy a day.  Fasting labs ordered PHQ no concerns Vitals look great-refilled metoprolol Pap and mammogram UTD Colonoscopy UTD Will get flu shot closer to Thanksgiving  Attempted cerumen removal but not able to get all debri out Debrox sent to use for 2 weeks then schedule nurse visit ear lavage appt   Tandy Gaw, PA-C

## 2023-05-15 LAB — CMP14+EGFR
ALT: 16 [IU]/L (ref 0–32)
AST: 23 [IU]/L (ref 0–40)
Albumin: 4.2 g/dL (ref 3.8–4.9)
Alkaline Phosphatase: 76 [IU]/L (ref 44–121)
BUN/Creatinine Ratio: 18 (ref 9–23)
BUN: 13 mg/dL (ref 6–24)
Bilirubin Total: 0.3 mg/dL (ref 0.0–1.2)
CO2: 23 mmol/L (ref 20–29)
Calcium: 9.7 mg/dL (ref 8.7–10.2)
Chloride: 104 mmol/L (ref 96–106)
Creatinine, Ser: 0.74 mg/dL (ref 0.57–1.00)
Globulin, Total: 2.6 g/dL (ref 1.5–4.5)
Glucose: 86 mg/dL (ref 70–99)
Potassium: 4.3 mmol/L (ref 3.5–5.2)
Sodium: 144 mmol/L (ref 134–144)
Total Protein: 6.8 g/dL (ref 6.0–8.5)
eGFR: 94 mL/min/{1.73_m2} (ref >59)

## 2023-05-15 LAB — LIPID PANEL
Chol/HDL Ratio: 2.9 ratio (ref 0.0–4.4)
Cholesterol, Total: 237 mg/dL — ABNORMAL HIGH (ref 100–199)
HDL: 83 mg/dL (ref >39)
LDL Chol Calc (NIH): 142 mg/dL — ABNORMAL HIGH (ref 0–99)
Triglycerides: 72 mg/dL (ref 0–149)
VLDL Cholesterol Cal: 12 mg/dL (ref 5–40)

## 2023-05-15 LAB — CBC WITH DIFFERENTIAL/PLATELET
Basophils Absolute: 0 10*3/uL (ref 0.0–0.2)
Basos: 1 %
EOS (ABSOLUTE): 0 10*3/uL (ref 0.0–0.4)
Eos: 1 %
Hematocrit: 42.6 % (ref 34.0–46.6)
Hemoglobin: 13.7 g/dL (ref 11.1–15.9)
Immature Grans (Abs): 0 10*3/uL (ref 0.0–0.1)
Immature Granulocytes: 0 %
Lymphocytes Absolute: 1.8 10*3/uL (ref 0.7–3.1)
Lymphs: 50 %
MCH: 28.2 pg (ref 26.6–33.0)
MCHC: 32.2 g/dL (ref 31.5–35.7)
MCV: 88 fL (ref 79–97)
Monocytes Absolute: 0.3 10*3/uL (ref 0.1–0.9)
Monocytes: 7 %
Neutrophils Absolute: 1.5 10*3/uL (ref 1.4–7.0)
Neutrophils: 41 %
Platelets: 232 10*3/uL (ref 150–450)
RBC: 4.86 x10E6/uL (ref 3.77–5.28)
RDW: 12.3 % (ref 11.7–15.4)
WBC: 3.7 10*3/uL (ref 3.4–10.8)

## 2023-05-15 LAB — VITAMIN D 25 HYDROXY (VIT D DEFICIENCY, FRACTURES): Vit D, 25-Hydroxy: 81.6 ng/mL (ref 30.0–100.0)

## 2023-05-15 LAB — TSH: TSH: 2.44 u[IU]/mL (ref 0.450–4.500)

## 2023-05-15 NOTE — Progress Notes (Signed)
[name],   Vitamin D looks GREAT.  Kidney, liver, glucose look wonderful.  Thyroid normal range.   HDL, good cholesterol, looks amazing.  LDL, bad cholesterol, not to goal.  10 year risk below 7.5 percent. Continue to keep low fat healthy diet and regular exercise. Recheck in 1 year.   Marland Kitchen.The 10-year ASCVD risk score (Arnett DK, et al., 2019) is: 3.2%   Values used to calculate the score:     Age: 57 years     Sex: Female     Is Non-Hispanic African American: Yes     Diabetic: No     Tobacco smoker: No     Systolic Blood Pressure: 122 mmHg     Is BP treated: No     HDL Cholesterol: 83 mg/dL     Total Cholesterol: 237 mg/dL

## 2023-05-20 ENCOUNTER — Ambulatory Visit
Admission: EM | Admit: 2023-05-20 | Discharge: 2023-05-20 | Disposition: A | Discharge status: Home / Self Care | Payer: BC Managed Care – PPO | Attending: Family Medicine | Admitting: Family Medicine

## 2023-05-20 ENCOUNTER — Other Ambulatory Visit: Payer: Self-pay

## 2023-05-20 DIAGNOSIS — H6122 Impacted cerumen, left ear: Secondary | ICD-10-CM | POA: Diagnosis not present

## 2023-05-20 NOTE — ED Provider Notes (Signed)
[facility]    CSN: [id number] Arrival date & time: 05/20/23  1634      History   Chief Complaint Chief Complaint  Patient presents with   Ear Fullness    HPI [name] is a 57 y.o. female.   Patient has cerumen impaction in her left ear.  She is using Debrox.  She still feels like she cannot hear.  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   Bruises easily 04/07/2017   Temporal pain 04/07/2017   Teeth grinding 11/16/2016   Tingling of face 11/16/2016   Left breast mass 06/04/2016   Nasal polyposis 03/25/2014   Asthma, chronic 02/09/2014   Constipation 09/21/2013   Allergic rhinitis 09/21/2013   Paroxysmal supraventricular tachycardia (HCC) 01/28/2013   Anemia 12/22/2012   Esophageal stricture 12/16/2012   Irregular menstrual bleeding 02/27/2011    Past Surgical History:  Procedure Laterality Date   CESAREAN SECTION     x 2 last in 68   NASAL POLYP SURGERY     TUBAL LIGATION  2000    OB History     Gravida  2   Para  2   Term      Preterm      AB      Living  2      SAB      IAB      Ectopic      Multiple      Live Births               Home Medications    Prior to Admission medications   Medication Sig Start Date End Date Taking? Authorizing Provider  budesonide-formoterol (SYMBICORT) 160-4.5 MCG/ACT inhaler Inhale 2 puffs into the lungs daily at 2 PM. 07/19/16   [provider]  carbamide peroxide  (DEBROX) 6.5 % OTIC solution Place 10 drops into the left ear 2 (two) times daily. Dispense 1 bottle 05/14/23   Breeback, Jade L, PA-C  metoprolol succinate (TOPROL-XL) 25 MG 24 hr tablet Take 1 tablet (25 mg total) by mouth daily. 05/14/23   Breeback, Lonna Cobb, PA-C  Multiple Vitamin (MULTIVITAMIN) tablet Take 1 tablet by mouth daily.    [provider]    Family History Family History  Problem Relation Age of Onset   Diabetes Mother    Hypertension Mother    Dementia Mother     Social History Social History   Tobacco Use   Smoking status: Never   Smokeless tobacco: Never  Vaping Use   Vaping status: Never Used  Substance Use Topics   Alcohol use: Yes    Comment: occasionally   Drug use: No     Allergies   Amoxicillin   Review of Systems Review of Systems See HPI  Physical Exam Triage Vital Signs ED Triage Vitals  Encounter Vitals Group     BP 05/20/23 1637 128/80     Systolic BP Percentile --  Diastolic BP Percentile --      Pulse Rate 05/20/23 1637 67     Resp 05/20/23 1637 16     Temp 05/20/23 1637 (!) 97.5 F (36.4 C)     Temp src --      SpO2 05/20/23 1637 99 %     Weight --      Height --      Head Circumference --      Peak Flow --      Pain Score 05/20/23 1641 0     Pain Loc --      Pain Education --      Exclude from Growth Chart --    No data found.  Updated Vital Signs BP 128/80   Pulse 67   Temp (!) 97.5 F (36.4 C)   Resp 16   LMP 08/16/2018   SpO2 99%      Physical Exam Constitutional:      General: She is not in acute distress.    Appearance: She is well-developed and normal weight.  HENT:     Head: Normocephalic and atraumatic.     Right Ear: Tympanic membrane, ear canal and external ear normal.     Left Ear: Tympanic membrane and ear canal normal.     Ears:     Comments: Both canals are narrowed.  Right TM and canal are normal.  Left TM by cerumen.  After lavage it is clear Eyes:     Conjunctiva/sclera:  Conjunctivae normal.     Pupils: Pupils are equal, round, and reactive to light.  Cardiovascular:     Rate and Rhythm: Normal rate.  Pulmonary:     Effort: Pulmonary effort is normal. No respiratory distress.  Abdominal:     General: There is no distension.     Palpations: Abdomen is soft.  Musculoskeletal:        General: Normal range of motion.     Cervical back: Normal range of motion.  Skin:    General: Skin is warm and dry.  Neurological:     Mental Status: She is alert.      UC Treatments / Results  Labs (all labs ordered are listed, but only abnormal results are displayed) Labs Reviewed - No data to display  EKG   Radiology No results found.  Procedures Procedures (including critical care time)  Medications Ordered in UC Medications - No data to display  Initial Impression / Assessment and Plan / UC Course  I have reviewed the triage vital signs and the nursing notes.  Pertinent labs & imaging results that were available during my care of the patient were reviewed by me and considered in my medical decision making (see chart for details).     Final Clinical Impressions(s) / UC Diagnoses   Final diagnoses:  None   Discharge Instructions   None    ED Prescriptions   None    PDMP not reviewed this encounter.   Eustace Moore, MD 05/20/23 [phone]

## 2023-05-20 NOTE — ED Triage Notes (Signed)
Left ear stopped up, wants it flushed. Has tried debrox which has not helped.

## 2023-05-20 NOTE — Discharge Instructions (Signed)
Turn as needed

## 2023-05-21 ENCOUNTER — Ambulatory Visit: Payer: Self-pay

## 2023-12-23 ENCOUNTER — Ambulatory Visit (INDEPENDENT_AMBULATORY_CARE_PROVIDER_SITE_OTHER): Payer: Self-pay | Admitting: Physician Assistant

## 2023-12-23 ENCOUNTER — Encounter: Payer: Self-pay | Admitting: Physician Assistant

## 2023-12-23 VITALS — BP 116/65 | HR 80 | Ht 67.0 in | Wt 146.0 lb

## 2023-12-23 DIAGNOSIS — M7712 Lateral epicondylitis, left elbow: Secondary | ICD-10-CM | POA: Diagnosis not present

## 2023-12-23 DIAGNOSIS — M7711 Lateral epicondylitis, right elbow: Secondary | ICD-10-CM

## 2023-12-23 MED ORDER — DICLOFENAC SODIUM 1 % EX GEL: 4.0000 g | Freq: Four times a day (QID) | CUTANEOUS | 1 refills | Status: AC

## 2023-12-23 NOTE — Progress Notes (Unsigned)
   Acute Office Visit  Subjective:     Patient ID: [name], female    DOB: 27-Dec-1965, 58 y.o.   MRN: [id number]  No chief complaint on file.   HPI Patient is in today for ***  ROS      Objective:    LMP 08/16/2018  {Vitals History (Optional):23777}  Physical Exam  No results found for any visits on 12/23/23.      Assessment & Plan:   Problem List Items Addressed This Visit   None   No orders of the defined types were placed in this encounter.   No follow-ups on file.  Israel Wunder, PA-C

## 2023-12-23 NOTE — Patient Instructions (Signed)
 Exercises for Tennis Elbow Elbow exercises can help you get better if you have tennis elbow. Only do the exercises you were told to do. Make sure you know how to do the exercises safely. Follow the steps below. It's normal to feel mild discomfort. Stop if you feel pain or your pain gets worse. Do not start these exercises until told by your health care provider. Stretching and range-of-motion exercises These exercises warm up your muscles and joints. They can help your elbow move better and be more flexible. Wrist flexion, assisted  Straighten your left / right elbow in front of you with your palm facing down toward the floor. If told by your provider, bend your left / right elbow to a 90-degree angle (right angle) at your side. Do this instead of holding it straight. With your other hand, gently push over the back of your left / right hand so your fingers point toward the floor. Stop when you feel a gentle stretch on the back of your forearm. Hold this position for __________ seconds. Repeat __________ times. Do this exercise __________ times a day. Wrist extension, assisted  Straighten your left / right elbow in front of you with your palm facing up toward the ceiling. If told by your provider, bend your left / right elbow to a 90-degree angle at your side. Do this instead of holding it straight. With your other hand, gently pull your left / right hand and fingers toward the floor. Stop when you feel a gentle stretch on the palm side of your forearm. Hold this position for __________ seconds. Repeat __________ times. Do this exercise __________ times a day. Assisted forearm rotation, supination  Sit or stand with your elbows at your side. Bend your left / right elbow to a 90-degree angle. Using your uninjured hand, turn your left / right palm up toward the ceiling. Stop when you feel a gentle stretch along the inside of your forearm. Hold this position for __________ seconds. Repeat  __________ times. Do this exercise __________ times a day. Assisted forearm rotation, pronation  Sit or stand with your elbows at your side. Bend your left / right elbow to a 90-degree angle. Using your uninjured hand, turn your left / right palm down toward the floor. Stop when you feel a gentle stretch along the outside of your forearm. Hold this position for __________ seconds. Repeat __________ times. Do this exercise __________ times a day. Strengthening exercises These exercises build strength and endurance in your forearm and elbow. Endurance is the ability to use your muscles for a long time, even after they get tired. Radial deviation  Stand with a __________ weight or a hammer in your left / right hand. Or, sit while holding a rubber exercise band or tubing, with your left / right forearm supported on a table or counter. Position your forearm so your thumb faces the ceiling, as if you're going to clap your hands. Raise your hand up in front of you so your thumb moves toward the ceiling, or pull up on the rubber tubing. Keep your forearm and elbow still. Only move your wrist. Hold this position for __________ seconds. Slowly go back to the starting position. Repeat __________ times. Do this exercise __________ times a day. Wrist extension, eccentric  Sit with your left / right forearm palm-down and supported on a table or other surface. Let your left / right wrist extend over the edge of the surface. Hold a __________ weight or a piece of  exercise band or tubing in your left / right hand. If using a rubber exercise band or tubing, hold the other end of the tubing with your other hand. Use your uninjured hand to move your left / right hand up toward the ceiling. Take your uninjured hand away. Slowly go back to the starting position using only your left / right hand. Repeat __________ times. Do this exercise __________ times a day. Wrist extension Do not do this exercise if it  causes pain at the outside of your elbow. Only do this exercise if told. Sit with your left / right forearm supported on a table or other surface and your palm turned down toward the floor. Let your left / right wrist extend over the edge of the surface. Hold a __________ weight or a piece of rubber exercise band or tubing. If using a rubber exercise band or tubing, hold the band or tubing in place with your other hand to provide resistance. Slowly bend your wrist so your hand moves up toward the ceiling. Move only your wrist. Keep your forearm and elbow still. Hold this position for __________ seconds. Slowly go back to the starting position. Repeat __________ times. Do this exercise __________ times a day. Forearm rotation, supination To do this exercise, you'll need a lightweight hammer or rubber mallet. Sit with your left / right forearm supported on a table or other surface. Bend your elbow to a 90-degree angle. Position your forearm so that your palm faces down toward the floor, with your hand resting over the edge of the table. Hold a hammer in your left / right hand. To make this exercise easier, hold the hammer near the head of the hammer. To make this exercise harder, hold the hammer near the end of the handle. Without moving your wrist or elbow, slowly turn your forearm so your palm faces up toward the ceiling. Hold this position for __________ seconds. Slowly go back to the starting position. Repeat __________ times. Do this exercise __________ times a day. Shoulder blade squeeze  Sit in a stable chair or stand with good posture. If you're sitting down, don't let your back touch the back of the chair. Your arms should be at your sides with your elbows bent to a 90-degree angle. Position your forearms so that your thumbs face the ceiling. Without lifting your shoulders up, squeeze your shoulder blades tightly together. Hold this position for __________ seconds. Slowly release. Go  back to the starting position. Repeat __________ times. Do this exercise __________ times a day. This information is not intended to replace advice given to you by your health care provider. Make sure you discuss any questions you have with your health care provider. Document Revised: 01/24/2023 Document Reviewed: 01/24/2023 Elsevier Patient Education  2024 [employer].

## 2023-12-24 ENCOUNTER — Other Ambulatory Visit (HOSPITAL_COMMUNITY): Payer: Self-pay

## 2023-12-24 ENCOUNTER — Telehealth: Payer: Self-pay

## 2023-12-24 NOTE — Telephone Encounter (Signed)
 Pharmacy Patient Advocate Encounter  Received notification from [facility] that Prior Authorization for Diclofenac 1% gel has been DENIED.  See denial reason below. No denial letter attached in CMM. Will attach denial letter to Media tab once received.   PA #/Case ID/Reference #: [id number]     Diclofenac 1% gel is available OTC

## 2023-12-24 NOTE — Telephone Encounter (Signed)
 Pharmacy Patient Advocate Encounter   Received notification from CoverMyMeds that prior authorization for Diclofenac 1% gel is required/requested.   Insurance verification completed.   The patient is insured through [facility] .   Per test claim: PA required; PA submitted to above mentioned insurance via CoverMyMeds Key/confirmation #/EOC [facility] Status is pending

## 2023-12-25 NOTE — Telephone Encounter (Signed)
 The PA for diclofenac gel denied by the insurance. Patient has been updated via a Radio broadcast assistant.

## 2024-02-05 ENCOUNTER — Other Ambulatory Visit: Payer: Self-pay | Admitting: Physician Assistant

## 2024-02-05 DIAGNOSIS — Z1231 Encounter for screening mammogram for malignant neoplasm of breast: Secondary | ICD-10-CM

## 2024-03-26 ENCOUNTER — Ambulatory Visit

## 2024-03-26 DIAGNOSIS — Z1231 Encounter for screening mammogram for malignant neoplasm of breast: Secondary | ICD-10-CM | POA: Diagnosis not present

## 2024-03-30 ENCOUNTER — Ambulatory Visit: Payer: Self-pay | Admitting: Physician Assistant

## 2024-03-30 NOTE — Progress Notes (Signed)
 Normal mammogram. Follow up in 1 year.

## 2024-05-12 ENCOUNTER — Ambulatory Visit
Admission: EM | Admit: 2024-05-12 | Discharge: 2024-05-12 | Disposition: A | Discharge status: Home / Self Care | Attending: Family Medicine | Admitting: Family Medicine

## 2024-05-12 DIAGNOSIS — J0100 Acute maxillary sinusitis, unspecified: Secondary | ICD-10-CM

## 2024-05-12 DIAGNOSIS — J3489 Other specified disorders of nose and nasal sinuses: Secondary | ICD-10-CM

## 2024-05-12 DIAGNOSIS — H6122 Impacted cerumen, left ear: Secondary | ICD-10-CM | POA: Diagnosis not present

## 2024-05-12 MED ORDER — DOXYCYCLINE HYCLATE 100 MG PO CAPS: 100.0000 mg | ORAL_CAPSULE | Freq: Two times a day (BID) | ORAL | 0 refills | Status: DC

## 2024-05-12 MED ORDER — PREDNISONE 20 MG PO TABS: ORAL_TABLET | ORAL | 0 refills | Status: DC

## 2024-05-12 NOTE — ED Provider Notes (Signed)
 [name]    CSN: [id number] Arrival date & time: 05/12/24  1652      History   Chief Complaint Chief Complaint  Patient presents with   Facial Pain    HPI [name] is a 58 y.o. female.   HPI Pleasant 58 year old female presents with lateral facial pressure and sinus congestion for 1 week.  Patient believes that she has a sinus infection.  PMH significant for bilateral lower extremity edema, temporal pain, and HLD.  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   Bruises easily 04/07/2017   Temporal pain 04/07/2017   Teeth grinding 11/16/2016   Tingling of face 11/16/2016   Left breast mass 06/04/2016   Nasal polyposis 03/25/2014   Asthma, chronic 02/09/2014   Constipation 09/21/2013   Allergic rhinitis 09/21/2013   Paroxysmal supraventricular tachycardia 01/28/2013   Anemia 12/22/2012   Esophageal stricture 12/16/2012   Irregular menstrual bleeding 02/27/2011    Past Surgical History:  Procedure Laterality Date   CESAREAN SECTION     x 2 last in 76   NASAL POLYP SURGERY     TUBAL LIGATION  2000    OB History     Gravida  2   Para  2   Term      Preterm      AB      Living  2      SAB      IAB      Ectopic      Multiple      Live Births               Home Medications    Prior to Admission medications   Medication Sig Start Date End Date Taking? Authorizing Provider  doxycycline  (VIBRAMYCIN ) 100 MG capsule Take 1 capsule (100 mg total) by  mouth 2 (two) times daily for 7 days. 05/12/24 05/19/24 Yes Teddy Sharper, FNP  predniSONE  (DELTASONE ) 20 MG tablet Take 3 tabs PO daily x 5 days. 05/12/24  Yes Teddy Sharper, FNP  budesonide-formoterol (SYMBICORT) 160-4.5 MCG/ACT inhaler Inhale 2 puffs into the lungs daily at 2 PM. 07/19/16   [provider]  diclofenac  Sodium (VOLTAREN ) 1 % GEL Apply 4 g topically 4 (four) times daily. To affected joint. 12/23/23   Breeback, Jade L, PA-C  metoprolol  succinate (TOPROL -XL) 25 MG 24 hr tablet Take 1 tablet (25 mg total) by mouth daily. 05/14/23   Breeback, Jade L, PA-C  Multiple Vitamin (MULTIVITAMIN) tablet Take 1 tablet by mouth daily.    [provider]    Family History Family History  Problem Relation Age of Onset   Diabetes Mother    Hypertension Mother    Dementia Mother     Social History Social History   Tobacco Use   Smoking status: Never   Smokeless tobacco: Never  Vaping Use   Vaping status: Never Used  Substance Use Topics   Alcohol use: Yes    Comment: occasionally   Drug use: No  Allergies   Amoxicillin    Review of Systems Review of Systems  HENT:  Positive for sinus pressure.      Physical Exam Triage Vital Signs ED Triage Vitals  Encounter Vitals Group     BP      Girls Systolic BP Percentile      Girls Diastolic BP Percentile      Boys Systolic BP Percentile      Boys Diastolic BP Percentile      Pulse      Resp      Temp      Temp src      SpO2      Weight      Height      Head Circumference      Peak Flow      Pain Score      Pain Loc      Pain Education      Exclude from Growth Chart    No data found.  Updated Vital Signs BP 118/80   Pulse 82   Temp 98.1 F (36.7 C)   Resp 19   LMP 08/16/2018   SpO2 98%   Visual Acuity Right Eye Distance:   Left Eye Distance:   Bilateral Distance:    Right Eye Near:   Left Eye Near:    Bilateral Near:     Physical Exam Vitals and nursing note reviewed.   Constitutional:      General: She is not in acute distress.    Appearance: Normal appearance. She is normal weight. She is not ill-appearing.  HENT:     Head: Normocephalic and atraumatic.     Right Ear: Tympanic membrane, ear canal and external ear normal.     Left Ear: External ear normal.     Ears:     Comments: Left EAC-occluded with excessive cerumen unable to visualize the left TM    Mouth/Throat:     Mouth: Mucous membranes are moist.     Pharynx: Oropharynx is clear.  Eyes:     Extraocular Movements: Extraocular movements intact.     Conjunctiva/sclera: Conjunctivae normal.     Pupils: Pupils are equal, round, and reactive to light.  Cardiovascular:     Rate and Rhythm: Normal rate and regular rhythm.     Heart sounds: Normal heart sounds.  Pulmonary:     Effort: Pulmonary effort is normal.     Breath sounds: Normal breath sounds. No wheezing, rhonchi or rales.  Musculoskeletal:        General: Normal range of motion.  Skin:    General: Skin is warm and dry.  Neurological:     General: No focal deficit present.     Mental Status: She is alert and oriented to person, place, and time. Mental status is at baseline.  Psychiatric:        Mood and Affect: Mood normal.        Behavior: Behavior normal.      UC Treatments / Results  Labs (all labs ordered are listed, but only abnormal results are displayed) Labs Reviewed - No data to display  EKG   Radiology No results found.  Procedures Procedures (including critical care time)  Medications Ordered in UC Medications - No data to display  Initial Impression / Assessment and Plan / UC Course  I have reviewed the triage vital signs and the nursing notes.  Pertinent labs & imaging results that were available during my care of the patient were reviewed  by me and considered in my medical decision making (see chart for details).     MDM: 1.  Acute maxillary sinusitis, recurrence not specified-Rx'd doxycycline   100 mg capsule: Take 1 capsule twice daily x 7 days; 2.  Sinus pressure-Rx'd prednisone  20 mg tablet: Take 3 tablets p.o. daily x 5 days; 3.  Excessive cerumen in left ear canal-patient deferred left EAC ear lavage this evening due to time.  Advised to follow-up with PCP regarding cerumen impaction at next office visit.  Patient agreed and verbalized understanding of these instructions and this plan of care. Advised patient take medications as directed with food to completion.  Advised take prednisone  with first dose of doxycycline  for the next 5 of 7 days.  Encouraged to increase daily water intake to 64 ounces per day while taking these medications.  Advised if symptoms worsen and/or unresolved please follow-up with your PCP, ENT, or here for further evaluation.  Final Clinical Impressions(s) / UC Diagnoses   Final diagnoses:  Acute maxillary sinusitis, recurrence not specified  Sinus pressure  Excessive cerumen in left ear canal     Discharge Instructions      Advised patient take medications as directed with food to completion.  Advised take prednisone  with first dose of doxycycline  for the next 5 of 7 days.  Encouraged to increase daily water intake to 64 ounces per day while taking these medications.  Advised if symptoms worsen and/or unresolved please follow-up with your PCP, ENT, or here for further evaluation.     ED Prescriptions     Medication Sig Dispense Auth. Provider   doxycycline  (VIBRAMYCIN ) 100 MG capsule Take 1 capsule (100 mg total) by mouth 2 (two) times daily for 7 days. 14 capsule Haeleigh Streiff, FNP   predniSONE  (DELTASONE ) 20 MG tablet Take 3 tabs PO daily x 5 days. 15 tablet Shamika Pedregon, FNP      PDMP not reviewed this encounter.   Teddy Sharper, FNP 05/12/24 1734

## 2024-05-12 NOTE — Discharge Instructions (Addendum)
 Advised patient take medications as directed with food to completion.  Advised take prednisone  with first dose of doxycycline  for the next 5 of 7 days.  Encouraged to increase daily water intake to 64 ounces per day while taking these medications.  Advised if symptoms worsen and/or unresolved please follow-up with your PCP, ENT, or here for further evaluation.

## 2024-05-12 NOTE — ED Triage Notes (Signed)
 Pt presents to uc with co right sided head/ facial pain for one week. Pt is concerned for a sinus infection. Pt has been taking tyelnol otc.

## 2024-05-15 ENCOUNTER — Encounter: Payer: Self-pay | Admitting: Physician Assistant

## 2024-05-15 ENCOUNTER — Ambulatory Visit (INDEPENDENT_AMBULATORY_CARE_PROVIDER_SITE_OTHER): Admitting: Physician Assistant

## 2024-05-15 VITALS — BP 118/70 | HR 78 | Ht 67.0 in | Wt 145.0 lb

## 2024-05-15 DIAGNOSIS — J4550 Severe persistent asthma, uncomplicated: Secondary | ICD-10-CM | POA: Insufficient documentation

## 2024-05-15 DIAGNOSIS — J320 Chronic maxillary sinusitis: Secondary | ICD-10-CM

## 2024-05-15 DIAGNOSIS — I471 Supraventricular tachycardia, unspecified: Secondary | ICD-10-CM

## 2024-05-15 DIAGNOSIS — Z131 Encounter for screening for diabetes mellitus: Secondary | ICD-10-CM

## 2024-05-15 DIAGNOSIS — E559 Vitamin D deficiency, unspecified: Secondary | ICD-10-CM

## 2024-05-15 DIAGNOSIS — Z0000 Encounter for general adult medical examination without abnormal findings: Secondary | ICD-10-CM

## 2024-05-15 DIAGNOSIS — Z13220 Encounter for screening for lipoid disorders: Secondary | ICD-10-CM

## 2024-05-15 DIAGNOSIS — H6122 Impacted cerumen, left ear: Secondary | ICD-10-CM | POA: Insufficient documentation

## 2024-05-15 MED ORDER — METOPROLOL SUCCINATE ER 25 MG PO TB24: 25.0000 mg | ORAL_TABLET | Freq: Every day | ORAL | 3 refills | Status: AC

## 2024-05-15 NOTE — Patient Instructions (Signed)
 Pneumonia vaccine after 59 years old Flu shot annual Tdap every 10 years  Health Maintenance, Female Adopting a healthy lifestyle and getting preventive care are important in promoting health and wellness. Ask your health care provider about: The right schedule for you to have regular tests and exams. Things you can do on your own to prevent diseases and keep yourself healthy. What should I know about diet, weight, and exercise? Eat a healthy diet  Eat a diet that includes plenty of vegetables, fruits, low-fat dairy products, and lean protein. Do not eat a lot of foods that are high in solid fats, added sugars, or sodium. Maintain a healthy weight Body mass index (BMI) is used to identify weight problems. It estimates body fat based on height and weight. Your health care provider can help determine your BMI and help you achieve or maintain a healthy weight. Get regular exercise Get regular exercise. This is one of the most important things you can do for your health. Most adults should: Exercise for at least 150 minutes each week. The exercise should increase your heart rate and make you sweat (moderate-intensity exercise). Do strengthening exercises at least twice a week. This is in addition to the moderate-intensity exercise. Spend less time sitting. Even light physical activity can be beneficial. Watch cholesterol and blood lipids Have your blood tested for lipids and cholesterol at 59 years of age, then have this test every 5 years. Have your cholesterol levels checked more often if: Your lipid or cholesterol levels are high. You are older than 59 years of age. You are at high risk for heart disease. What should I know about cancer screening? Depending on your health history and family history, you may need to have cancer screening at various ages. This may include screening for: Breast cancer. Cervical cancer. Colorectal cancer. Skin cancer. Lung cancer. What should I know about  heart disease, diabetes, and high blood pressure? Blood pressure and heart disease High blood pressure causes heart disease and increases the risk of stroke. This is more likely to develop in people who have high blood pressure readings or are overweight. Have your blood pressure checked: Every 3-5 years if you are 42-73 years of age. Every year if you are 24 years old or older. Diabetes Have regular diabetes screenings. This checks your fasting blood sugar level. Have the screening done: Once every three years after age 52 if you are at a normal weight and have a low risk for diabetes. More often and at a younger age if you are overweight or have a high risk for diabetes. What should I know about preventing infection? Hepatitis B If you have a higher risk for hepatitis B, you should be screened for this virus. Talk with your health care provider to find out if you are at risk for hepatitis B infection. Hepatitis C Testing is recommended for: Everyone born from [birth year] through 1966. Anyone with known risk factors for hepatitis C. Sexually transmitted infections (STIs) Get screened for STIs, including gonorrhea and chlamydia, if: You are sexually active and are younger than 59 years of age. You are older than 59 years of age and your health care provider tells you that you are at risk for this type of infection. Your sexual activity has changed since you were last screened, and you are at increased risk for chlamydia or gonorrhea. Ask your health care provider if you are at risk. Ask your health care provider about whether you are at high risk for  HIV. Your health care provider may recommend a prescription medicine to help prevent HIV infection. If you choose to take medicine to prevent HIV, you should first get tested for HIV. You should then be tested every 3 months for as long as you are taking the medicine. Pregnancy If you are about to stop having your period (premenopausal) and you may  become pregnant, seek counseling before you get pregnant. Take 400 to 800 micrograms (mcg) of folic acid every day if you become pregnant. Ask for birth control (contraception) if you want to prevent pregnancy. Osteoporosis and menopause Osteoporosis is a disease in which the bones lose minerals and strength with aging. This can result in bone fractures. If you are 47 years old or older, or if you are at risk for osteoporosis and fractures, ask your health care provider if you should: Be screened for bone loss. Take a calcium or vitamin D  supplement to lower your risk of fractures. Be given hormone replacement therapy (HRT) to treat symptoms of menopause. Follow these instructions at home: Alcohol use Do not drink alcohol if: Your health care provider tells you not to drink. You are pregnant, may be pregnant, or are planning to become pregnant. If you drink alcohol: Limit how much you have to: 0-1 drink a day. Know how much alcohol is in your drink. In the [place] one drink equals one 12 oz bottle of beer (355 mL), one 5 oz glass of wine (148 mL), or one 1 oz glass of hard liquor (44 mL). Lifestyle Do not use any products that contain nicotine or tobacco. These products include cigarettes, chewing tobacco, and vaping devices, such as e-cigarettes. If you need help quitting, ask your health care provider. Do not use street drugs. Do not share needles. Ask your health care provider for help if you need support or information about quitting drugs. General instructions Schedule regular health, dental, and eye exams. Stay current with your vaccines. Tell your health care provider if: You often feel depressed. You have ever been abused or do not feel safe at home. Summary Adopting a healthy lifestyle and getting preventive care are important in promoting health and wellness. Follow your health care provider's instructions about healthy diet, exercising, and getting tested or screened for  diseases. Follow your health care provider's instructions on monitoring your cholesterol and blood pressure. This information is not intended to replace advice given to you by your health care provider. Make sure you discuss any questions you have with your health care provider. Document Revised: 11/21/2020 Document Reviewed: 11/21/2020 Elsevier Patient Education  2024 [employer].

## 2024-05-15 NOTE — Progress Notes (Signed)
 Complete physical exam  Patient: [name]   DOB: 1965/08/15   58 y.o. Female  MRN: [id number]  Subjective:    Chief Complaint  Patient presents with   Annual Exam    Discussed the use of AI scribe software for clinical note transcription with the patient, who gave verbal consent to proceed.  History of Present Illness [name] is a 58 year old female who presents for CPE.   Sinus symptoms - Headache extending from the eyes to the back of the head with occasional sharp pain - Pressure and tenderness in the sinus region - Partial symptom relief with antibiotics and prednisone  via UC, though slight pressure persists  Medication side effects - Difficulty sleeping during the first two nights of prednisone  treatment  Asthma - No chest pain or shortness of breath - No recent use of rescue inhaler - doing great on symbicort  Gastrointestinal and swallowing symptoms - No problems with swallowing - Regular bowel movements  Current medications and supplements - Currently taking zinc and vitamin D  (unsure of exact vitamin D  dosage) - Takes daily baby aspirin  Preventive health maintenance - Fasting for blood work - Up to date on mammogram, Pap smear, colonoscopy, and bone density screenings    Most recent fall risk assessment:    05/15/2024   12:56 PM  Fall Risk   Falls in the past year? 0  Number falls in past yr: 0  Injury with Fall? 0  Risk for fall due to : No Fall Risks  Follow up Falls evaluation completed     Most recent depression screenings:    05/15/2024   12:56 PM 05/14/2023    9:00 AM  PHQ 2/9 Scores  PHQ - 2 Score 0 0  PHQ- 9 Score  0    Vision:Within last year and Dental: No current dental problems and Receives regular dental care  Patient Active Problem List   Diagnosis Date Noted   Severe persistent asthma without complication (HCC) 05/15/2024   Impacted cerumen of left ear 05/15/2024   Hearing loss of left ear due to cerumen  impaction 05/14/2023   Vitamin D  deficiency 05/14/2023   Osteopenia 06/06/2022   Bleeding after intercourse 09/14/2020   Atrophic vaginitis 09/14/2020   Bilateral lower extremity edema 10/23/2019   Hyperlipidemia 04/13/2019   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   Bruises easily 04/07/2017   Temporal pain 04/07/2017   Teeth grinding 11/16/2016   Tingling of face 11/16/2016   Left breast mass 06/04/2016   Nasal polyposis 03/25/2014   Asthma, chronic 02/09/2014   Constipation 09/21/2013   Allergic rhinitis 09/21/2013   Paroxysmal supraventricular tachycardia 01/28/2013   Anemia 12/22/2012   Esophageal stricture 12/16/2012   Irregular menstrual bleeding 02/27/2011   Past Medical History:  Diagnosis Date   Asthma    Esophageal stricture 12/16/2012   Dilated Spring 2014    Irregular menstrual bleeding 02/27/2011   Tachycardia    Past Surgical History:  Procedure Laterality Date   CESAREAN SECTION     x 2 last in 55   NASAL POLYP SURGERY     TUBAL LIGATION  2000   Family History  Problem Relation Age of Onset   Diabetes Mother    Hypertension Mother    Dementia Mother    Allergies  Allergen Reactions   Amoxicillin  Other (See Comments)    It just doesn't work, it sits in my body and doesn't do what it needs to do. just sits in my body & doesn't  do anything      Patient Care Team: Demyan Fugate L, PA-C as PCP - General (Family Medicine)   Outpatient Medications Prior to Visit  Medication Sig   budesonide-formoterol (SYMBICORT) 160-4.5 MCG/ACT inhaler Inhale 2 puffs into the lungs daily at 2 PM.   diclofenac  Sodium (VOLTAREN ) 1 % GEL Apply 4 g topically 4 (four) times daily. To affected joint.   doxycycline  (VIBRAMYCIN ) 100 MG capsule Take 1 capsule (100 mg total) by mouth 2 (two) times daily for 7 days.   Multiple Vitamin (MULTIVITAMIN) tablet Take 1 tablet by mouth daily.   predniSONE  (DELTASONE ) 20 MG tablet Take 3 tabs PO daily x 5 days.   [DISCONTINUED] metoprolol   succinate (TOPROL -XL) 25 MG 24 hr tablet Take 1 tablet (25 mg total) by mouth daily.   No facility-administered medications prior to visit.    Review of Systems  All other systems reviewed and are negative.         Objective:     BP 118/70   Pulse 78   Ht 5' 7 (1.702 m)   Wt 145 lb (65.8 kg)   LMP 08/16/2018   SpO2 99%   BMI 22.71 kg/m  BP Readings from Last 3 Encounters:  05/15/24 118/70  05/12/24 118/80  12/23/23 116/65   Wt Readings from Last 3 Encounters:  05/15/24 145 lb (65.8 kg)  12/23/23 146 lb (66.2 kg)  05/14/23 142 lb (64.4 kg)      Physical Exam  BP 118/70   Pulse 78   Ht 5' 7 (1.702 m)   Wt 145 lb (65.8 kg)   LMP 08/16/2018   SpO2 99%   BMI 22.71 kg/m   General Appearance:    Alert, cooperative, no distress, appears stated age  Head:    Normocephalic, without obvious abnormality, atraumatic. No temple tenderness to palpation.   Eyes:    PERRL, conjunctiva/corneas clear, EOM's intact, fundi    benign, both eyes  Ears:    Normal TM of right. Left ear with cerumen impaction.   Nose:   Nares normal, septum midline, mucosa normal, no drainage    or sinus tenderness  Throat:   Lips, mucosa, and tongue normal; teeth and gums normal  Neck:   Supple, symmetrical, trachea midline, no adenopathy;    thyroid:  no enlargement/tenderness/nodules; no carotid   bruit or JVD  Back:     Symmetric, no curvature, ROM normal, no CVA tenderness  Lungs:     Clear to auscultation bilaterally, respirations unlabored  Chest Wall:    No tenderness or deformity   Heart:    Regular rate and rhythm, S1 and S2 normal, no murmur, rub   or gallop     Abdomen:     Soft, non-tender, bowel sounds active all four quadrants,    no masses, no organomegaly        Extremities:   Extremities normal, atraumatic, no cyanosis or edema  Pulses:   2+ and symmetric all extremities  Skin:   Skin color, texture, turgor normal, no rashes or lesions  Lymph nodes:   Cervical,  supraclavicular, and axillary nodes normal  Neurologic:   CNII-XII intact, normal strength, sensation and reflexes    throughout      Assessment & Plan:    Routine Health Maintenance and Physical Exam  Immunization History  Administered Date(s) Administered   Fluzone Influenza virus vaccine,trivalent (IIV3), split virus 04/29/2015   Influenza Split 04/07/2018   Influenza, Seasonal, Injecte, Preservative Fre 06/07/2023  Influenza,inj,Quad PF,6+ Mos 03/28/2016, 04/05/2017, 04/07/2018, 05/08/2019, 05/03/2021, 04/19/2022   Influenza-Unspecified 03/28/2016, 04/05/2017, 05/27/2020   PFIZER Comirnaty(Gray Top)Covid-19 Tri-Sucrose Vaccine 09/08/2019, 09/29/2019, 05/12/2020, 12/10/2020   PFIZER(Purple Top)SARS-COV-2 Vaccination 09/08/2019, 09/29/2019, 05/12/2020   PPD Test 02/09/2014   Pfizer(Comirnaty)Fall Seasonal Vaccine 12 years and older 05/13/2022, 05/10/2023   Pneumococcal Conjugate-13 12/27/2017   Pneumococcal Polysaccharide-23 04/05/2017   Tdap 02/09/2014   Zoster Recombinant(Shingrix ) 04/09/2019, 06/15/2019   Zoster, Unspecified 04/09/2019, 06/15/2019    Health Maintenance  Topic Date Due   Hepatitis B Vaccines 19-59 Average Risk (1 of 3 - 19+ 3-dose series) Never done   Pneumococcal Vaccine: 50+ Years (3 of 3 - PCV20 or PCV21) 12/28/2022   DTaP/Tdap/Td (2 - Td or Tdap) 02/10/2024   COVID-19 Vaccine (10 - 2025-26 season) 05/31/2024 (Originally 03/16/2024)   Influenza Vaccine  10/13/2024 (Originally 02/14/2024)   DEXA SCAN  06/06/2024   Colonoscopy  11/13/2024   Mammogram  03/26/2025   Cervical Cancer Screening (HPV/Pap Cotest)  09/14/2025   Hepatitis C Screening  Completed   HIV Screening  Completed   Zoster Vaccines- Shingrix   Completed   HPV VACCINES  Aged Out   Meningococcal B Vaccine  Aged Out    Discussed health benefits of physical activity, and encouraged her to engage in regular exercise appropriate for her age and condition. [name] was seen today for annual  exam.  Diagnoses and all orders for this visit:  Routine physical examination -     CBC with Differential/Platelet -     CMP14+EGFR -     Lipid panel -     TSH -     VITAMIN D  25 Hydroxy (Vit-D Deficiency, Fractures) -     Vitamin B12  Paroxysmal supraventricular tachycardia -     metoprolol  succinate (TOPROL -XL) 25 MG 24 hr tablet; Take 1 tablet (25 mg total) by mouth daily.  Vitamin D  deficiency -     VITAMIN D  25 Hydroxy (Vit-D Deficiency, Fractures)  Severe persistent asthma without complication (HCC)  Screening for lipid disorders -     Lipid panel  Screening for diabetes mellitus -     CMP14+EGFR  Right maxillary sinusitis  Impacted cerumen of left ear    Assessment & Plan Adult Wellness Visit Routine wellness visit with all screenings current. Slightly elevated blood pressure noted. - Recheck blood pressure and improved on 2nd recheck - Perform blood work for vitamin D , B12, kidney, liver, glucose, and cholesterol levels.  Acute sinusitis, improving Symptoms improving with antibiotics and prednisone . Reported difficulty sleeping due to prednisone . - Continue antibiotics for 10 days. - Consider over-the-counter sleep aids such as Unisom or Nyquil if sleep disturbances persist.  Asthma, stable Asthma well-controlled with no recent symptoms. - managed by allergy/pulmonology  Cerumen impaction, left ear - use debrox OTC to help remove  General Health Maintenance Discussed vaccinations. Flu shot recommended post-recovery. Pneumonia booster status needs confirmation. Tetanus vaccine due. - Administer flu shot approximately one week after completing current treatment and when feeling well. - Confirm status of pneumonia booster and administer if not received in the last nine years. - Administer tetanus vaccine, can be done with flu shot or separately.    Return in about 1 year (around 05/15/2025).     Domingo Fuson, PA-C

## 2024-05-16 LAB — TSH: TSH: 3.42 u[IU]/mL (ref 0.450–4.500)

## 2024-05-16 LAB — CBC WITH DIFFERENTIAL/PLATELET
Basophils Absolute: 0.1 x10E3/uL (ref 0.0–0.2)
Basos: 1 %
EOS (ABSOLUTE): 0 x10E3/uL (ref 0.0–0.4)
Eos: 1 %
Hematocrit: 41.3 % (ref 34.0–46.6)
Hemoglobin: 12.9 g/dL (ref 11.1–15.9)
Immature Grans (Abs): 0 x10E3/uL (ref 0.0–0.1)
Immature Granulocytes: 0 %
Lymphocytes Absolute: 3.8 x10E3/uL — ABNORMAL HIGH (ref 0.7–3.1)
Lymphs: 56 %
MCH: 28.3 pg (ref 26.6–33.0)
MCHC: 31.2 g/dL — ABNORMAL LOW (ref 31.5–35.7)
MCV: 91 fL (ref 79–97)
Monocytes Absolute: 0.4 x10E3/uL (ref 0.1–0.9)
Monocytes: 6 %
Neutrophils Absolute: 2.4 x10E3/uL (ref 1.4–7.0)
Neutrophils: 36 %
Platelets: 260 x10E3/uL (ref 150–450)
RBC: 4.56 x10E6/uL (ref 3.77–5.28)
RDW: 12.7 % (ref 11.7–15.4)
WBC: 6.8 x10E3/uL (ref 3.4–10.8)

## 2024-05-16 LAB — CMP14+EGFR
ALT: 14 IU/L (ref 0–32)
AST: 18 IU/L (ref 0–40)
Albumin: 4.5 g/dL (ref 3.8–4.9)
Alkaline Phosphatase: 71 IU/L (ref 49–135)
BUN/Creatinine Ratio: 19 (ref 9–23)
BUN: 14 mg/dL (ref 6–24)
Bilirubin Total: 0.4 mg/dL (ref 0.0–1.2)
CO2: 25 mmol/L (ref 20–29)
Calcium: 10 mg/dL (ref 8.7–10.2)
Chloride: 101 mmol/L (ref 96–106)
Creatinine, Ser: 0.72 mg/dL (ref 0.57–1.00)
Globulin, Total: 2.4 g/dL (ref 1.5–4.5)
Glucose: 89 mg/dL (ref 70–99)
Potassium: 3.7 mmol/L (ref 3.5–5.2)
Sodium: 143 mmol/L (ref 134–144)
Total Protein: 6.9 g/dL (ref 6.0–8.5)
eGFR: 96 mL/min/1.73 (ref >59)

## 2024-05-16 LAB — VITAMIN D 25 HYDROXY (VIT D DEFICIENCY, FRACTURES): Vit D, 25-Hydroxy: 59.6 ng/mL (ref 30.0–100.0)

## 2024-05-16 LAB — LIPID PANEL
Chol/HDL Ratio: 2.8 ratio (ref 0.0–4.4)
Cholesterol, Total: 246 mg/dL — ABNORMAL HIGH (ref 100–199)
HDL: 88 mg/dL (ref >39)
LDL Chol Calc (NIH): 145 mg/dL — ABNORMAL HIGH (ref 0–99)
Triglycerides: 79 mg/dL (ref 0–149)
VLDL Cholesterol Cal: 13 mg/dL (ref 5–40)

## 2024-05-16 LAB — VITAMIN B12: Vitamin B-12: >2000 pg/mL — ABNORMAL HIGH (ref 232–1245)

## 2024-05-18 ENCOUNTER — Ambulatory Visit: Payer: Self-pay | Admitting: Physician Assistant

## 2024-05-18 NOTE — Progress Notes (Signed)
 [name],   Normal vitamin D .  Thyroid looks great.  Kidney, liver, glucose look great.  Vitamin b12 too high. Cut back on any supplements in half.   Cholesterol not to goal. No improvement.  Your 10 year cardiovascular risk is 3.3 percent which is below 7.5 percent so you don't have to start a statin but if you wanted to get LDL to goal we could start a low dose statin. Thoughts?   SABRASABRAThe 10-year ASCVD risk score (Arnett DK, et al., 2019) is: 3.3%   Values used to calculate the score:     Age: 58 years     Clincally relevant sex: Female     Is Non-Hispanic African American: Yes     Diabetic: No     Tobacco smoker: No     Systolic Blood Pressure: 118 mmHg     Is BP treated: No     HDL Cholesterol: 88 mg/dL     Total Cholesterol: 246 mg/dL

## 2024-05-19 MED ORDER — ROSUVASTATIN CALCIUM 5 MG PO TABS: 5.0000 mg | ORAL_TABLET | Freq: Every day | ORAL | 3 refills | Status: DC

## 2024-05-19 NOTE — Progress Notes (Signed)
 Sent crestor 5mg  daily to pharmacy.

## 2024-05-29 NOTE — Telephone Encounter (Signed)
 You can get RSV here at clinic. She can get flu shot and Tdap together. Covid shot can be at pharmacy or here at clinic.

## 2024-07-06 ENCOUNTER — Ambulatory Visit: Admitting: Physician Assistant

## 2024-07-06 ENCOUNTER — Encounter: Payer: Self-pay | Admitting: Physician Assistant

## 2024-07-06 VITALS — BP 118/70 | HR 98 | Ht 67.0 in | Wt 144.0 lb

## 2024-07-06 DIAGNOSIS — I471 Supraventricular tachycardia, unspecified: Secondary | ICD-10-CM

## 2024-07-06 DIAGNOSIS — L74512 Primary focal hyperhidrosis, palms: Secondary | ICD-10-CM | POA: Insufficient documentation

## 2024-07-06 DIAGNOSIS — L74513 Primary focal hyperhidrosis, soles: Secondary | ICD-10-CM

## 2024-07-06 DIAGNOSIS — Z23 Encounter for immunization: Secondary | ICD-10-CM | POA: Diagnosis not present

## 2024-07-06 NOTE — Patient Instructions (Signed)
Hyperhidrosis Hyperhidrosis is a condition in which the body sweats a lot more than normal (excessively). Sweating is a necessary function for a human body. It is normal to sweat when you are hot, physically active, or anxious. However, hyperhidrosis is sweating to an excessive degree. Although the condition is not a serious one, it can cause embarrassment. There are two kinds of hyperhidrosis: Primary hyperhidrosis. The sweating usually localizes in one part of your body, such as your underarms, or in a few areas, such as your feet, face, underarms, and hands. This is the more common kind of hyperhidrosis. Secondary hyperhidrosis. This type usually affects your entire body. What are the causes? The cause of this condition depends on the kind of hyperhidrosis that you have. Primary hyperhidrosis may be caused by sweat glands that are more active than normal. Secondary hyperhidrosis may be caused by an underlying condition or by taking certain medicines, such as antidepressants or diabetes medicines. Possible conditions that may cause secondary hyperhidrosis include: Diabetes. Hot flashes during menopause. Certain types of cancers. Obesity. Overactive thyroid (hyperthyroidism). Nervous system injury or disorders. What increases the risk? You are more likely to develop primary hyperhidrosis if you have a family history of the condition. What are the signs or symptoms? Symptoms of this condition include: Feeling like you are sweating constantly, even while you are not being active. Having skin that peels or gets paler or softer in the areas where you sweat the most. Being able to see sweat on your skin. Other symptoms depend on the kind of hyperhidrosis that you have. Symptoms of primary hyperhidrosis may include: Sweating in the same location on both sides of your body. Sweating only during the day and not while you are sleeping. Sweating in specific areas, such as your underarms, palms,  feet, and face. Symptoms of secondary hyperhidrosis may include: Sweating all over your body. Sweating even while you sleep. How is this diagnosed? This condition may be diagnosed by: Medical history. Physical exam. You may also have other tests, including: Tests to measure the amount of sweat you produce and to show the areas where you sweat the most. These tests may involve: Using color-changing chemicals to show patterns of sweating on the skin. Weighing paper that has been applied to the skin. This will show the amount of sweat that your body produces. Measuring the amount of water that evaporates from the skin. Using infrared technology to show patterns of sweating on the skin. Tests to check for other conditions that may be causing excess sweating. These tests may include blood, urine, or imaging tests. How is this treated? Treatment for this condition depends on the kind of hyperhidrosis that you have and the areas of your body that are affected. Your health care provider will also treat any underlying conditions. Treatment may include: Medicines, such as: Antiperspirants. These are medicines that stop sweat. Injectable medicines. These may include small injections of botulinum toxin. Oral medicines. These are taken by mouth to treat underlying conditions and other symptoms. A procedure to: Temporarily turn off the sweat glands in your hands and feet (iontophoresis). Remove your sweat glands. Cut or destroy the nerves so that they do not send a signal to the sweat glands (sympathectomy). Follow these instructions at home: Lifestyle  Limit or avoid foods or beverages that may increase your risk of sweating, such as: Spicy food. Caffeine. Alcohol. Foods that contain monosodium glutamate (MSG). If your feet sweat: Wear sandals when possible. Do not wear cotton socks. Wear socks  that remove or wick moisture from your feet. Wear leather shoes. Avoid wearing the same pair of  shoes for two days in a row. Try placing sweat pads under your clothes to prevent underarm sweat from showing. Keep a journal of your sweat symptoms and when they occur. This may help you identify things that trigger your sweating. General instructions Take over-the-counter and prescription medicines only as told by your health care provider. Use antiperspirants as told by your health care provider. Consider joining a hyperhidrosis support group. Where to find more information [employer]: [employer] Contact a health care provider if: You have new symptoms. Your symptoms get worse. Summary Hyperhidrosis is a condition in which the body sweats a lot more than normal. With primary hyperhidrosis, the sweating usually localizes in one part of your body, such as your underarms, or in a few areas, such as your feet, face, underarms, and hands. It is caused by overactive sweat glands in the affected area. With secondary hyperhidrosis, the sweating affects your entire body. This is caused by an underlying condition or by taking certain medicines. Treatment for this condition depends on the kind of hyperhidrosis that you have and the parts of your body that are affected. This information is not intended to replace advice given to you by your health care provider. Make sure you discuss any questions you have with your health care provider. Document Revised: 08/29/2021 Document Reviewed: 08/29/2021 Elsevier Patient Education  2024 [employer].

## 2024-07-06 NOTE — Progress Notes (Unsigned)
" ° °  Acute Office Visit  Subjective:     Patient ID: [name], female    DOB: [birth year], 58 y.o.   MRN: [id number]  No chief complaint on file.   HPI   ROS      Objective:    LMP 08/16/2018  {Vitals History (Optional):23777}  Physical Exam  No results found for any visits on 07/06/24.      Assessment & Plan:   Problem List Items Addressed This Visit   None   No orders of the defined types were placed in this encounter.   No follow-ups on file.  Cline Draheim, PA-C   "

## 2024-07-07 ENCOUNTER — Ambulatory Visit: Payer: Self-pay | Admitting: Physician Assistant

## 2024-07-07 ENCOUNTER — Encounter: Payer: Self-pay | Admitting: Physician Assistant

## 2024-07-07 LAB — TSH+FREE T4
Free T4: 1.23 ng/dL (ref 0.82–1.77)
TSH: 1.34 u[IU]/mL (ref 0.450–4.500)

## 2024-07-07 LAB — CMP14+EGFR
ALT: 13 IU/L (ref 0–32)
AST: 18 IU/L (ref 0–40)
Albumin: 4.2 g/dL (ref 3.8–4.9)
Alkaline Phosphatase: 69 IU/L (ref 49–135)
BUN/Creatinine Ratio: 17 (ref 9–23)
BUN: 13 mg/dL (ref 6–24)
Bilirubin Total: 0.5 mg/dL (ref 0.0–1.2)
CO2: 24 mmol/L (ref 20–29)
Calcium: 9.7 mg/dL (ref 8.7–10.2)
Chloride: 105 mmol/L (ref 96–106)
Creatinine, Ser: 0.78 mg/dL (ref 0.57–1.00)
Globulin, Total: 2.4 g/dL (ref 1.5–4.5)
Glucose: 93 mg/dL (ref 70–99)
Potassium: 3.8 mmol/L (ref 3.5–5.2)
Sodium: 144 mmol/L (ref 134–144)
Total Protein: 6.6 g/dL (ref 6.0–8.5)
eGFR: 87 mL/min/1.73

## 2024-07-07 LAB — ESTRADIOL: Estradiol: <5 pg/mL

## 2024-07-07 LAB — CBC WITH DIFFERENTIAL/PLATELET
Basophils Absolute: 0 x10E3/uL (ref 0.0–0.2)
Basos: 1 %
EOS (ABSOLUTE): 0 x10E3/uL (ref 0.0–0.4)
Eos: 1 %
Hematocrit: 41.1 % (ref 34.0–46.6)
Hemoglobin: 12.9 g/dL (ref 11.1–15.9)
Immature Grans (Abs): 0 x10E3/uL (ref 0.0–0.1)
Immature Granulocytes: 0 %
Lymphocytes Absolute: 2 x10E3/uL (ref 0.7–3.1)
Lymphs: 53 %
MCH: 28.2 pg (ref 26.6–33.0)
MCHC: 31.4 g/dL — ABNORMAL LOW (ref 31.5–35.7)
MCV: 90 fL (ref 79–97)
Monocytes Absolute: 0.4 x10E3/uL (ref 0.1–0.9)
Monocytes: 10 %
Neutrophils Absolute: 1.3 x10E3/uL — ABNORMAL LOW (ref 1.4–7.0)
Neutrophils: 35 %
Platelets: 233 x10E3/uL (ref 150–450)
RBC: 4.58 x10E6/uL (ref 3.77–5.28)
RDW: 12.5 % (ref 11.7–15.4)
WBC: 3.7 x10E3/uL (ref 3.4–10.8)

## 2024-07-07 LAB — TESTOSTERONE: Testosterone: <3 ng/dL — ABNORMAL LOW (ref 4–50)

## 2024-07-07 LAB — PROGESTERONE: Progesterone: <0.1 ng/mL

## 2024-07-07 NOTE — Progress Notes (Signed)
 [name],   Thyroid looks GREAT.  Kidney, liver, glucose look good.  Hormones are post-menopausal range and low but no alarming features.   There are medications to put on hands and feet to help them not sweat. Keep a diary of the sweating first and see if we can find any triggers.

## 2024-07-13 LAB — CORTISOL, URINE, FREE
Cortisol (Ur), Free: 10 ug/(24.h) (ref 6–42)
Cortisol,F,ug/L,U: 8 ug/L

## 2024-07-14 NOTE — Progress Notes (Signed)
 Cortisol levels normal in urine.

## 2024-08-13 ENCOUNTER — Ambulatory Visit: Admitting: Urgent Care

## 2024-08-13 ENCOUNTER — Ambulatory Visit: Admitting: Medical-Surgical

## 2024-08-13 VITALS — BP 118/72 | HR 69 | Temp 97.7°F | Ht 67.0 in | Wt 150.0 lb

## 2024-08-13 DIAGNOSIS — M545 Low back pain, unspecified: Secondary | ICD-10-CM

## 2024-08-13 DIAGNOSIS — N898 Other specified noninflammatory disorders of vagina: Secondary | ICD-10-CM | POA: Diagnosis not present

## 2024-08-13 DIAGNOSIS — E782 Mixed hyperlipidemia: Secondary | ICD-10-CM

## 2024-08-13 DIAGNOSIS — N760 Acute vaginitis: Secondary | ICD-10-CM

## 2024-08-13 LAB — POCT URINALYSIS DIP (CLINITEK)
Bilirubin, UA: NEGATIVE
Blood, UA: NEGATIVE
Glucose, UA: NEGATIVE mg/dL
Ketones, POC UA: NEGATIVE mg/dL
Leukocytes, UA: NEGATIVE
Nitrite, UA: NEGATIVE
POC PROTEIN,UA: NEGATIVE
Spec Grav, UA: 1.02
Urobilinogen, UA: 0.2 U/dL
pH, UA: 6.5

## 2024-08-13 MED ORDER — ROSUVASTATIN CALCIUM 5 MG PO TABS: 5.0000 mg | ORAL_TABLET | Freq: Every day | ORAL | 3 refills | Status: AC

## 2024-08-13 MED ORDER — METRONIDAZOLE 500 MG PO TABS: 500.0000 mg | ORAL_TABLET | Freq: Two times a day (BID) | ORAL | 0 refills | Status: AC

## 2024-08-13 MED ORDER — FLUCONAZOLE 150 MG PO TABS: 150.0000 mg | ORAL_TABLET | Freq: Once | ORAL | 0 refills | Status: AC

## 2024-08-13 NOTE — Patient Instructions (Addendum)
 I suspect you have bacterial vaginosis. Please read the attached handout.   Take flagyl  twice daily for one week. Do not drink alcohol while on this medication.  Take a single tablet of diflucan  after completion of the antibiotics.  If your symptoms persist, please return for recheck

## 2024-08-13 NOTE — Progress Notes (Signed)
 "  Established Patient Office Visit  Subjective:  Patient ID: [name], female    DOB: 01-14-1966  Age: 60 y.o. MRN: [id number]  Chief Complaint  Patient presents with   Vaginal Itching    And discharge x 2-3 weeks    HPI  Discussed the use of AI scribe software for clinical note transcription with the patient, who gave verbal consent to proceed.  History of Present Illness   [name] is a 59 year old female who presents with vaginal discharge, itching, and lower abdominal pain.  She has been experiencing a white, chalky, and milky vaginal discharge accompanied by itching, which began early last week. No dysuria, hematuria, fever, chills, nausea, or vomiting. She lost her sense of smell a few years ago, preventing her from detecting any odor associated with the discharge. She recalls experiencing similar symptoms many years ago during her college years.  She reports lower abdominal pain localized to the suprapubic region, which began around the same time as the discharge and itching. The pain is centralized. She denies fever, chills, nausea, vomiting, or dysuria. No history of hematuria or vaginal bleeding.  She also experiences lower to mid back pain, which started approximately three weeks ago. The pain is more pronounced when lying down at night and is located in the lower lumbar region, occasionally spreading upwards. She has taken Tylenol  for the back pain, which provides temporary relief. No history of chronic back issues, kidney infections, or stones.       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   Bruises easily 04/07/2017   Temporal pain 04/07/2017   Teeth grinding 11/16/2016   Tingling of face 11/16/2016   Left breast mass 06/04/2016   Nasal polyposis 03/25/2014   Asthma, chronic 02/09/2014   Constipation 09/21/2013   Allergic rhinitis 09/21/2013   Paroxysmal supraventricular tachycardia 01/28/2013   Anemia 12/22/2012   Esophageal stricture 12/16/2012   Irregular menstrual bleeding 02/27/2011   Past Medical History:  Diagnosis Date   Asthma    Esophageal stricture 12/16/2012   Dilated Spring 2014    Irregular menstrual bleeding 02/27/2011   Tachycardia    Past Surgical History:  Procedure Laterality Date   CESAREAN SECTION     x 2 last in 74   NASAL POLYP SURGERY     TUBAL LIGATION  2000   Social History[1]    ROS: as noted in HPI  Objective:     BP 118/72   Pulse 69   Temp 97.7 F (36.5 C)   Ht 5' 7 (1.702 m)   Wt 150 lb (68 kg)   LMP 08/16/2018   SpO2 100%   BMI 23.49 kg/m  BP Readings from Last 3 Encounters:  08/13/24 118/72  07/06/24 118/70  05/15/24 118/70   Wt Readings from Last 3 Encounters:  08/13/24 150 lb (68 kg)  07/06/24 144 lb (65.3 kg)  05/15/24 145 lb (65.8 kg)      Physical Exam Vitals and nursing note reviewed. Exam conducted with a chaperone present.  Constitutional:  General: She is not in acute distress.    Appearance: Normal appearance. She is not ill-appearing, toxic-appearing or diaphoretic.  HENT:     Head: Normocephalic and atraumatic.  Eyes:     General:        Right eye: No discharge.        Left eye: No discharge.     Extraocular Movements: Extraocular movements intact.     Pupils: Pupils are equal, round, and reactive to light.  Cardiovascular:     Rate and Rhythm: Normal rate.  Pulmonary:     Effort: Pulmonary effort is normal. No respiratory distress.  Abdominal:     General: Abdomen is flat. There is  no distension.     Palpations: Abdomen is soft.     Tenderness: There is no abdominal tenderness. There is no right CVA tenderness, left CVA tenderness, guarding or rebound.  Genitourinary:    Pubic Area: No rash.      Labia:        Right: No rash, tenderness, lesion or injury.        Left: No rash, tenderness, lesion or injury.      Vagina: Vaginal discharge (milky discharge) present.     Cervix: Normal. No cervical motion tenderness, friability, erythema or eversion.     Uterus: Not enlarged and not tender.      Adnexa:        Right: No tenderness.         Left: No tenderness.    Musculoskeletal:       Back:  Lymphadenopathy:     Lower Body: No right inguinal adenopathy. No left inguinal adenopathy.  Skin:    General: Skin is warm and dry.     Coloration: Skin is not jaundiced.     Findings: No bruising, erythema or rash.  Neurological:     Mental Status: She is alert and oriented to person, place, and time.      Results for orders placed or performed in visit on 08/13/24  POCT URINALYSIS DIP (CLINITEK)  Result Value Ref Range   Color, UA yellow yellow   Clarity, UA clear clear   Glucose, UA negative negative mg/dL   Bilirubin, UA negative negative   Ketones, POC UA negative negative mg/dL   Spec Grav, UA 8.979 8.989 - 1.025   Blood, UA negative negative   pH, UA 6.5 5.0 - 8.0   POC PROTEIN,UA negative negative, trace   Urobilinogen, UA 0.2 0.2 or 1.0 E.U./dL   Nitrite, UA Negative Negative   Leukocytes, UA Negative Negative    Last CBC Lab Results  Component Value Date   WBC 3.7 07/06/2024   HGB 12.9 07/06/2024   HCT 41.1 07/06/2024   MCV 90 07/06/2024   MCH 28.2 07/06/2024   RDW 12.5 07/06/2024   PLT 233 07/06/2024   Last metabolic panel Lab Results  Component Value Date   GLUCOSE 93 07/06/2024   NA 144 07/06/2024   K 3.8 07/06/2024   CL 105 07/06/2024   CO2 24 07/06/2024   BUN 13 07/06/2024   CREATININE 0.78 07/06/2024   EGFR 87 07/06/2024    CALCIUM  9.7 07/06/2024   PROT 6.6 07/06/2024   ALBUMIN 4.2 07/06/2024   LABGLOB 2.4 07/06/2024   BILITOT 0.5 07/06/2024   ALKPHOS 69 07/06/2024   AST 18 07/06/2024   ALT 13 07/06/2024      The 10-year ASCVD risk score (Arnett DK, et al., 2019) is: 3.3%  Assessment & Plan:  Vaginal itching -     POCT URINALYSIS DIP (CLINITEK) -     NuSwab Vaginitis Plus (VG+) -     Fluconazole ; Take 1 tablet (150 mg total) by mouth once for 1 dose.  Dispense: 1 tablet; Refill: 0  Acute vaginitis -     POCT URINALYSIS DIP (CLINITEK) -     NuSwab Vaginitis Plus (VG+) -     metroNIDAZOLE ; Take 1 tablet (500 mg total) by mouth 2 (two) times daily.  Dispense: 14 tablet; Refill: 0  Mixed hyperlipidemia -     Rosuvastatin  Calcium ; Take 1 tablet (5 mg total) by mouth daily.  Dispense: 90 tablet; Refill: 3  Acute left-sided low back pain without sciatica  Assessment and Plan    Acute vaginitis White, chalky discharge and suprapubic itching suggest yeast infection. Physical exam more suggestive of BV - Performed pelvic exam and collected swab for discharge analysis. - Initiated therapy for yeast infection.  - Initiated therapy for BV  HLD Pt requested refill of crestor   Low back pain Sx suggestive of musculoskeletal cause given reproducibility. Likely secondary to BV. No sx concerning for PID or Pyelo. UA negative - continue warm compresses and tylenol  as needed        No follow-ups on file.   Benton LITTIE Gave, PA     [1]  Social History Tobacco Use   Smoking status: Never   Smokeless tobacco: Never  Vaping Use   Vaping status: Never Used  Substance Use Topics   Alcohol use: Yes    Comment: occasionally   Drug use: No   "

## 2024-08-14 ENCOUNTER — Encounter: Payer: Self-pay | Admitting: Urgent Care

## 2024-08-15 ENCOUNTER — Ambulatory Visit: Payer: Self-pay | Admitting: Urgent Care

## 2024-08-15 LAB — NUSWAB VAGINITIS PLUS (VG+)
Candida albicans, NAA: NEGATIVE
Candida glabrata, NAA: NEGATIVE
Chlamydia trachomatis, NAA: NEGATIVE
Neisseria gonorrhoeae, NAA: NEGATIVE
Trich vag by NAA: NEGATIVE

## 2025-05-19 ENCOUNTER — Encounter: Admitting: Physician Assistant
